# Patient Record
Sex: Male | Born: 1975 | Race: Black or African American | Hispanic: No | Marital: Single | Smoking: Current every day smoker
Health system: Southern US, Community
[De-identification: ages and names within clinical notes are randomized; demographics above are authoritative.]

## PROBLEM LIST (undated history)

## (undated) DIAGNOSIS — F209 Schizophrenia, unspecified: Secondary | ICD-10-CM

## (undated) DIAGNOSIS — K37 Unspecified appendicitis: Secondary | ICD-10-CM

## (undated) DIAGNOSIS — S82899A Other fracture of unspecified lower leg, initial encounter for closed fracture: Secondary | ICD-10-CM

## (undated) DIAGNOSIS — B1920 Unspecified viral hepatitis C without hepatic coma: Secondary | ICD-10-CM

## (undated) DIAGNOSIS — S42009A Fracture of unspecified part of unspecified clavicle, initial encounter for closed fracture: Secondary | ICD-10-CM

## (undated) DIAGNOSIS — F319 Bipolar disorder, unspecified: Secondary | ICD-10-CM

## (undated) HISTORY — PX: APPENDECTOMY: SHX54

## (undated) HISTORY — PX: ANKLE FRACTURE SURGERY: SHX122

---

## 2006-01-18 ENCOUNTER — Emergency Department (HOSPITAL_COMMUNITY): Admission: EM | Admit: 2006-01-18 | Discharge: 2006-01-18 | Payer: Self-pay | Admitting: Emergency Medicine

## 2006-03-09 ENCOUNTER — Emergency Department (HOSPITAL_COMMUNITY): Admission: EM | Admit: 2006-03-09 | Discharge: 2006-03-09 | Payer: Self-pay | Admitting: Family Medicine

## 2011-08-13 ENCOUNTER — Encounter: Payer: Self-pay | Admitting: Emergency Medicine

## 2011-08-13 ENCOUNTER — Emergency Department (HOSPITAL_COMMUNITY)
Admission: EM | Admit: 2011-08-13 | Discharge: 2011-08-13 | Disposition: A | Discharge status: Home / Self Care | Payer: Self-pay | Attending: Emergency Medicine | Admitting: Emergency Medicine

## 2011-08-13 DIAGNOSIS — R21 Rash and other nonspecific skin eruption: Secondary | ICD-10-CM | POA: Insufficient documentation

## 2011-08-13 DIAGNOSIS — L299 Pruritus, unspecified: Secondary | ICD-10-CM | POA: Insufficient documentation

## 2011-08-13 DIAGNOSIS — Z2089 Contact with and (suspected) exposure to other communicable diseases: Secondary | ICD-10-CM | POA: Insufficient documentation

## 2011-08-13 MED ORDER — CETIRIZINE HCL 10 MG PO CAPS: 10.0000 mg | ORAL_CAPSULE | Freq: Every day | ORAL | Status: DC

## 2011-08-13 MED ORDER — PERMETHRIN 5 % EX CREA: TOPICAL_CREAM | CUTANEOUS | Status: AC

## 2011-08-13 NOTE — ED Notes (Signed)
Pt will use cream

## 2011-08-13 NOTE — ED Provider Notes (Signed)
Medical screening examination/treatment/procedure(s) were performed by non-physician practitioner and as supervising physician I was immediately available for consultation/collaboration.  Geoffery Lyons, MD 08/13/11 [phone]

## 2011-08-13 NOTE — ED Provider Notes (Signed)
History     CSN: [id number] Arrival date & time: 08/13/2011  9:04 AM   First MD Initiated Contact with Patient 08/13/11 [phone]      No chief complaint on file.   (Consider location/radiation/quality/duration/timing/severity/associated sxs/prior treatment) HPI Comments: Patient with diffuse areas of itchiness over his body since yesterday. He states he was recently exposed to someone with scabies and thinks he may have the same. Patient has been using calamine lotion on his upper extremities with little relief. Patient denies fever or any other systemic symptoms.  Patient is a 35 y.o. male presenting with rash. The history is provided by the patient.  Rash  This is a new problem. The current episode started yesterday. The problem has not changed since onset.The problem is associated with nothing. There has been no fever. The rash is present on the torso, back, left arm and right arm. Associated symptoms include blisters and itching. Pertinent negatives include no pain and no weeping. He has tried anti-itch cream for the symptoms. The treatment provided mild relief.    No past medical history on file.  No past surgical history on file.  No family history on file.  History  Substance Use Topics  . Smoking status: Not on file  . Smokeless tobacco: Not on file  . Alcohol Use: Not on file      Review of Systems  Constitutional: Negative for fever.  HENT: Negative for sore throat and rhinorrhea.   Eyes: Negative for redness.  Respiratory: Negative for shortness of breath.   Cardiovascular: Negative for chest pain.  Gastrointestinal: Negative for abdominal pain.  Genitourinary: Negative for dysuria.  Musculoskeletal: Negative for myalgias.  Skin: Positive for itching and rash.  Neurological: Negative for headaches.    Allergies  Review of patient's allergies indicates no known allergies.  Home Medications   Current Outpatient Rx  Name Route Sig Dispense Refill  . IBUPROFEN  200 MG PO TABS Oral Take 200 mg by mouth every 6 (six) hours as needed. pain       BP 125/73  Pulse 75  Temp 98.6 F (37 C)  SpO2 100%  Physical Exam  Nursing note and vitals reviewed. Constitutional: He is oriented to person, place, and time. He appears well-developed and well-nourished.  HENT:  Head: Normocephalic and atraumatic.  Eyes: Conjunctivae are normal. Right eye exhibits no discharge. Left eye exhibits no discharge.  Neck: Neck supple.  Neurological: He is alert and oriented to person, place, and time.  Skin: Skin is warm and dry. No rash noted. There is erythema.       Diffusely scattered small wheals consistent with bite. Possibly scabies.  Psychiatric: He has a normal mood and affect.    ED Course  Procedures (including critical care time)  Labs Reviewed - No data to display No results found.   1. Scabies exposure    patient seen and examined. Given treatment for scabies. Counseled on techniques for removing infestation.    MDM  Patient with itchy areas consistent with bite. Will treat for scabies given recent exposure.       Eustace Moore [place], [place] 08/13/11 [phone]

## 2013-06-14 ENCOUNTER — Emergency Department (HOSPITAL_COMMUNITY)
Admission: EM | Admit: 2013-06-14 | Discharge: 2013-06-15 | Disposition: A | Discharge status: Home / Self Care | Payer: Self-pay | Attending: Emergency Medicine | Admitting: Emergency Medicine

## 2013-06-14 ENCOUNTER — Encounter (HOSPITAL_COMMUNITY): Payer: Self-pay | Admitting: Emergency Medicine

## 2013-06-14 DIAGNOSIS — Z5900 Homelessness unspecified: Secondary | ICD-10-CM

## 2013-06-14 DIAGNOSIS — R45851 Suicidal ideations: Secondary | ICD-10-CM | POA: Insufficient documentation

## 2013-06-14 DIAGNOSIS — F3289 Other specified depressive episodes: Secondary | ICD-10-CM | POA: Insufficient documentation

## 2013-06-14 DIAGNOSIS — F329 Major depressive disorder, single episode, unspecified: Secondary | ICD-10-CM

## 2013-06-14 DIAGNOSIS — F17200 Nicotine dependence, unspecified, uncomplicated: Secondary | ICD-10-CM | POA: Insufficient documentation

## 2013-06-14 DIAGNOSIS — F1020 Alcohol dependence, uncomplicated: Secondary | ICD-10-CM | POA: Diagnosis present

## 2013-06-14 DIAGNOSIS — F1010 Alcohol abuse, uncomplicated: Secondary | ICD-10-CM | POA: Insufficient documentation

## 2013-06-14 DIAGNOSIS — R12 Heartburn: Secondary | ICD-10-CM | POA: Insufficient documentation

## 2013-06-14 DIAGNOSIS — Z8719 Personal history of other diseases of the digestive system: Secondary | ICD-10-CM | POA: Insufficient documentation

## 2013-06-14 DIAGNOSIS — F411 Generalized anxiety disorder: Secondary | ICD-10-CM | POA: Insufficient documentation

## 2013-06-14 DIAGNOSIS — Z8781 Personal history of (healed) traumatic fracture: Secondary | ICD-10-CM | POA: Insufficient documentation

## 2013-06-14 DIAGNOSIS — F1420 Cocaine dependence, uncomplicated: Secondary | ICD-10-CM | POA: Diagnosis present

## 2013-06-14 DIAGNOSIS — Z8619 Personal history of other infectious and parasitic diseases: Secondary | ICD-10-CM | POA: Insufficient documentation

## 2013-06-14 HISTORY — DX: Unspecified appendicitis: K37

## 2013-06-14 HISTORY — DX: Fracture of unspecified part of unspecified clavicle, initial encounter for closed fracture: S42.009A

## 2013-06-14 HISTORY — DX: Other fracture of unspecified lower leg, initial encounter for closed fracture: S82.899A

## 2013-06-14 HISTORY — DX: Unspecified viral hepatitis C without hepatic coma: B19.20

## 2013-06-14 LAB — CBC
HCT: 47.3 % (ref 39.0–52.0)
Platelets: 242 10*3/uL (ref 150–400)
RDW: 12.5 % (ref 11.5–15.5)

## 2013-06-14 LAB — RAPID URINE DRUG SCREEN, HOSP PERFORMED
Amphetamines: NOT DETECTED
Barbiturates: NOT DETECTED
Benzodiazepines: NOT DETECTED
Opiates: NOT DETECTED
Tetrahydrocannabinol: NOT DETECTED

## 2013-06-14 LAB — COMPREHENSIVE METABOLIC PANEL
BUN: 13 mg/dL (ref 6–23)
CO2: 22 mEq/L (ref 19–32)
Calcium: 9.8 mg/dL (ref 8.4–10.5)
Creatinine, Ser: 1.02 mg/dL (ref 0.50–1.35)
GFR calc Af Amer: >90 mL/min (ref >90)
GFR calc non Af Amer: >90 mL/min (ref >90)
Potassium: 3.5 mEq/L (ref 3.5–5.1)
Total Bilirubin: 0.2 mg/dL — ABNORMAL LOW (ref 0.3–1.2)
Total Protein: 8.4 g/dL — ABNORMAL HIGH (ref 6.0–8.3)

## 2013-06-14 LAB — ACETAMINOPHEN LEVEL: Acetaminophen (Tylenol), Serum: <15 ug/mL (ref 10–30)

## 2013-06-14 MED ORDER — LORAZEPAM 1 MG PO TABS: 0.0000 mg | ORAL_TABLET | Freq: Two times a day (BID) | ORAL | Status: DC

## 2013-06-14 MED ORDER — IBUPROFEN 200 MG PO TABS: 600.0000 mg | ORAL_TABLET | Freq: Three times a day (TID) | ORAL | Status: DC | PRN

## 2013-06-14 MED ORDER — ALUM & MAG HYDROXIDE-SIMETH 200-200-20 MG/5ML PO SUSP: 30.0000 mL | ORAL | Status: DC | PRN

## 2013-06-14 MED ORDER — LORAZEPAM 1 MG PO TABS: 0.0000 mg | ORAL_TABLET | Freq: Four times a day (QID) | ORAL | Status: DC

## 2013-06-14 MED ORDER — ACETAMINOPHEN 325 MG PO TABS: 650.0000 mg | ORAL_TABLET | ORAL | Status: DC | PRN

## 2013-06-14 MED ORDER — NICOTINE 21 MG/24HR TD PT24: 21.0000 mg | MEDICATED_PATCH | Freq: Every day | TRANSDERMAL | Status: DC

## 2013-06-14 MED ORDER — LORAZEPAM 1 MG PO TABS: 1.0000 mg | ORAL_TABLET | Freq: Three times a day (TID) | ORAL | Status: DC | PRN

## 2013-06-14 MED ORDER — ONDANSETRON HCL 4 MG PO TABS: 4.0000 mg | ORAL_TABLET | Freq: Three times a day (TID) | ORAL | Status: DC | PRN

## 2013-06-14 NOTE — ED Notes (Signed)
Pt states that he quit taking his meds a few months ago bc he thought he was better. Pt states that he was drinking today and became overwhelmed with suicidal feelings. Pt denies any plan.

## 2013-06-14 NOTE — ED Provider Notes (Signed)
CSN: [id number]     Arrival date & time 06/14/13  2145 History   First MD Initiated Contact with Patient 06/14/13 2204     Chief Complaint  Patient presents with  . Suicidal  . Medical Clearance   (Consider location/radiation/quality/duration/timing/severity/associated sxs/prior Treatment) HPI Comments: Patient with history of anxiety and depression presents with complaint of worsening depression and suicidal ideation. Patient states that he has had worsening depression for some time but was overwhelmed with suicidal thoughts today. He spoke with a family member who urged him to come to the emergency department. Patient admits to being an alcoholic and continues to drink heavy amounts of alcohol. He admits to taking cocaine last night. He denies other drugs. He complains of heartburn but denies other medical complaints at the current time. Onset of symptoms insidious. Course is constant. Nothing makes symptoms better or worse.  The history is provided by the patient.    Past Medical History  Diagnosis Date  . Fx ankle   . Collar bone fracture   . Appendicitis   . Hepatitis C    Past Surgical History  Procedure Laterality Date  . Appendectomy     No family history on file. History  Substance Use Topics  . Smoking status: Current Every Day Smoker  . Smokeless tobacco: Never Used  . Alcohol Use: Yes    Review of Systems  Constitutional: Negative for fever.  HENT: Negative for rhinorrhea and sore throat.   Eyes: Negative for redness.  Respiratory: Negative for cough.   Cardiovascular: Negative for chest pain.  Gastrointestinal: Negative for nausea, vomiting, abdominal pain and diarrhea.  Genitourinary: Negative for dysuria.  Musculoskeletal: Negative for myalgias.  Skin: Negative for rash.  Neurological: Negative for headaches.  Psychiatric/Behavioral: Positive for suicidal ideas. Negative for sleep disturbance and self-injury. The patient is nervous/anxious.     Allergies   Review of patient's allergies indicates no known allergies.  Home Medications   Current Outpatient Rx  Name  Route  Sig  Dispense  Refill  . oxyCODONE-acetaminophen (PERCOCET/ROXICET) 5-325 MG per tablet   Oral   Take 1 tablet by mouth every 4 (four) hours as needed for pain.         Marland Kitchen PRESCRIPTION MEDICATION                BP 121/56  Pulse 92  Temp(Src) 98.5 F (36.9 C) (Oral)  Resp 20  Ht 5\' 7"  (1.702 m)  Wt 185 lb (83.915 kg)  BMI 28.97 kg/m2  SpO2 97% Physical Exam  Nursing note and vitals reviewed. Constitutional: He appears well-developed and well-nourished.  HENT:  Head: Normocephalic and atraumatic.  Eyes: Conjunctivae are normal. Right eye exhibits no discharge. Left eye exhibits no discharge.  Neck: Normal range of motion. Neck supple.  Cardiovascular: Normal rate, regular rhythm and normal heart sounds.   Pulmonary/Chest: Effort normal and breath sounds normal.  Abdominal: Soft. There is no tenderness.  Neurological: He is alert.  Skin: Skin is warm and dry.  Psychiatric: His mood appears anxious. He expresses suicidal ideation. He expresses no suicidal plans.  Patient is tearful    ED Course  Procedures (including critical care time) Labs Review Labs Reviewed  CBC - Abnormal; Notable for the following:    WBC 12.7 (*)    Hemoglobin 17.3 (*)    MCHC 36.6 (*)    All other components within normal limits  COMPREHENSIVE METABOLIC PANEL - Abnormal; Notable for the following:    Glucose, Bld 60 (*)  Total Protein 8.4 (*)    Total Bilirubin 0.2 (*)    All other components within normal limits  ETHANOL - Abnormal; Notable for the following:    Alcohol, Ethyl (B) 77 (*)    All other components within normal limits  SALICYLATE LEVEL - Abnormal; Notable for the following:    Salicylate Lvl <2.0 (*)    All other components within normal limits  URINE RAPID DRUG SCREEN (HOSP PERFORMED) - Abnormal; Notable for the following:    Cocaine POSITIVE (*)     All other components within normal limits  ACETAMINOPHEN LEVEL   Imaging Review No results found.  EKG Interpretation   None      10:19 PM Patient seen and examined. Work-up initiated. Holding orders completed.    Vital signs reviewed and are as follows: Filed Vitals:   06/14/13 2148  BP: 121/56  Pulse: 92  Temp: 98.5 F (36.9 C)  Resp: 20   11:26 PM Pt medically cleared. Accepted to [facility] by Luna Fuse to Dr. Shirlean Mylar service.    MDM   1. Depression   2. Alcohol abuse   3. Suicidal ideation    To [facility].     Renne Crigler, PA-C 06/14/13 2326

## 2013-06-14 NOTE — ED Notes (Signed)
Pt has been changed into blue scrubs.  Pt has coat, shoes, shirt, pants, underwear and shorts, wallet 2 debit cards and Identification card.

## 2013-06-14 NOTE — ED Notes (Signed)
Pt's belongings (2 bags) placed in TCU locker #26.

## 2013-06-15 ENCOUNTER — Inpatient Hospital Stay (HOSPITAL_COMMUNITY)
Admission: AD | Admit: 2013-06-15 | Discharge: 2013-06-19 | DRG: 897 | Disposition: A | Discharge status: Home / Self Care | Payer: No Typology Code available for payment source | Source: Intra-hospital | Attending: Psychiatry | Admitting: Psychiatry

## 2013-06-15 ENCOUNTER — Encounter (HOSPITAL_COMMUNITY): Payer: Self-pay

## 2013-06-15 DIAGNOSIS — Z5900 Homelessness unspecified: Secondary | ICD-10-CM

## 2013-06-15 DIAGNOSIS — R45851 Suicidal ideations: Secondary | ICD-10-CM

## 2013-06-15 DIAGNOSIS — F1420 Cocaine dependence, uncomplicated: Secondary | ICD-10-CM | POA: Diagnosis present

## 2013-06-15 DIAGNOSIS — F1010 Alcohol abuse, uncomplicated: Secondary | ICD-10-CM

## 2013-06-15 DIAGNOSIS — F1994 Other psychoactive substance use, unspecified with psychoactive substance-induced mood disorder: Secondary | ICD-10-CM | POA: Diagnosis present

## 2013-06-15 DIAGNOSIS — F1020 Alcohol dependence, uncomplicated: Principal | ICD-10-CM | POA: Diagnosis present

## 2013-06-15 DIAGNOSIS — Z79899 Other long term (current) drug therapy: Secondary | ICD-10-CM

## 2013-06-15 MED ORDER — ONDANSETRON HCL 4 MG PO TABS: 4.0000 mg | ORAL_TABLET | Freq: Three times a day (TID) | ORAL | Status: DC | PRN

## 2013-06-15 MED ORDER — IBUPROFEN 600 MG PO TABS
600.0000 mg | ORAL_TABLET | Freq: Three times a day (TID) | ORAL | Status: DC | PRN
  Administered 2013-06-16 – 2013-06-18 (×3): 600 mg via ORAL
  Filled 2013-06-15 (×3): qty 1

## 2013-06-15 MED ORDER — NICOTINE 21 MG/24HR TD PT24
21.0000 mg | MEDICATED_PATCH | Freq: Every day | TRANSDERMAL | Status: DC
  Administered 2013-06-15: 21 mg via TRANSDERMAL
  Administered 2013-06-15: 13:00:00 via TRANSDERMAL
  Administered 2013-06-17 – 2013-06-19 (×3): 21 mg via TRANSDERMAL
  Filled 2013-06-15 (×8): qty 1

## 2013-06-15 MED ORDER — ADULT MULTIVITAMIN W/MINERALS CH
1.0000 | ORAL_TABLET | Freq: Every day | ORAL | Status: DC
  Administered 2013-06-15 – 2013-06-19 (×5): 1 via ORAL
  Filled 2013-06-15 (×7): qty 1

## 2013-06-15 MED ORDER — HYDROXYZINE HCL 25 MG PO TABS
25.0000 mg | ORAL_TABLET | Freq: Four times a day (QID) | ORAL | Status: DC | PRN
  Administered 2013-06-16: 25 mg via ORAL
  Filled 2013-06-15: qty 1

## 2013-06-15 MED ORDER — LOPERAMIDE HCL 2 MG PO CAPS: 2.0000 mg | ORAL_CAPSULE | ORAL | Status: AC | PRN

## 2013-06-15 MED ORDER — CHLORDIAZEPOXIDE HCL 25 MG PO CAPS
25.0000 mg | ORAL_CAPSULE | ORAL | Status: AC
  Administered 2013-06-17 – 2013-06-18 (×2): 25 mg via ORAL
  Filled 2013-06-15 (×2): qty 1

## 2013-06-15 MED ORDER — TRAZODONE HCL 50 MG PO TABS
50.0000 mg | ORAL_TABLET | Freq: Every evening | ORAL | Status: DC | PRN
  Administered 2013-06-15 – 2013-06-18 (×4): 50 mg via ORAL
  Filled 2013-06-15 (×4): qty 1

## 2013-06-15 MED ORDER — ACETAMINOPHEN 325 MG PO TABS: 650.0000 mg | ORAL_TABLET | ORAL | Status: DC | PRN

## 2013-06-15 MED ORDER — THIAMINE HCL 100 MG/ML IJ SOLN: 100.0000 mg | Freq: Once | INTRAMUSCULAR | Status: DC

## 2013-06-15 MED ORDER — CHLORDIAZEPOXIDE HCL 25 MG PO CAPS
25.0000 mg | ORAL_CAPSULE | Freq: Three times a day (TID) | ORAL | Status: AC
  Administered 2013-06-16 – 2013-06-17 (×3): 25 mg via ORAL
  Filled 2013-06-15 (×3): qty 1

## 2013-06-15 MED ORDER — ONDANSETRON 4 MG PO TBDP: 4.0000 mg | ORAL_TABLET | Freq: Four times a day (QID) | ORAL | Status: AC | PRN

## 2013-06-15 MED ORDER — CHLORDIAZEPOXIDE HCL 25 MG PO CAPS
25.0000 mg | ORAL_CAPSULE | Freq: Every day | ORAL | Status: AC
  Administered 2013-06-19: 25 mg via ORAL
  Filled 2013-06-15: qty 1

## 2013-06-15 MED ORDER — MAGNESIUM HYDROXIDE 400 MG/5ML PO SUSP: 30.0000 mL | Freq: Every day | ORAL | Status: DC | PRN

## 2013-06-15 MED ORDER — CHLORDIAZEPOXIDE HCL 25 MG PO CAPS
25.0000 mg | ORAL_CAPSULE | Freq: Four times a day (QID) | ORAL | Status: AC | PRN
  Administered 2013-06-16: 25 mg via ORAL
  Filled 2013-06-15 (×2): qty 1

## 2013-06-15 MED ORDER — NICOTINE 21 MG/24HR TD PT24
MEDICATED_PATCH | TRANSDERMAL | Status: AC
  Filled 2013-06-15: qty 1

## 2013-06-15 MED ORDER — VITAMIN B-1 100 MG PO TABS
100.0000 mg | ORAL_TABLET | Freq: Every day | ORAL | Status: DC
  Administered 2013-06-16 – 2013-06-19 (×4): 100 mg via ORAL
  Filled 2013-06-15 (×6): qty 1

## 2013-06-15 MED ORDER — CHLORDIAZEPOXIDE HCL 25 MG PO CAPS
25.0000 mg | ORAL_CAPSULE | Freq: Four times a day (QID) | ORAL | Status: AC
  Administered 2013-06-15 – 2013-06-16 (×6): 25 mg via ORAL
  Filled 2013-06-15 (×6): qty 1

## 2013-06-15 MED ORDER — CHLORDIAZEPOXIDE HCL 25 MG PO CAPS
50.0000 mg | ORAL_CAPSULE | Freq: Once | ORAL | Status: AC
  Administered 2013-06-15: 50 mg via ORAL
  Filled 2013-06-15: qty 2
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
## 2013-06-15 NOTE — Progress Notes (Signed)
Patient admitted voluntarily requesting alcohol detox and medication to help his depression. Patient reports that he stopped taking his medication (Celexa) because it made him feel worse. Patient drinks (4) 40 oz beers daily and occasional THC use in order to make him feel better. Patient reports last using cocaine 10/9. Patient reports he is currently homeless. Medical hx include fx (left) ankle, collar bone fx, appendicitis and hepatitis C. Patient oriented to unit, snacks provided, safety maintained with 15 min checks. Patient denies si/hi/a/v hallucinations.

## 2013-06-15 NOTE — ED Notes (Signed)
RN Topher called to give report on pt while writer was in the middle of getting report/starting shift. One of writer's pt's is here for detox from ETOH, hx of seizures and had not been medicated all day. Thus Clinical research associate attended to this pt before calling to get report on this pt whom is now up for d/c to go to [facility]. Just spoke to RN Topher and explained delay in this writer calling him back.
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
## 2013-06-15 NOTE — BHH Group Notes (Signed)
BHH Group Notes:  (Clinical Social Work)  06/15/2013   3:00-4:00PM  Summary of Progress/Problems:   The main focus of today's process group was for the patient to identify ways in which they have sabotaged their own mental health wellness/recovery.  Motivational interviewing was used to explore the reasons they engage in this behavior, and reasons they may have for wanting to change.  The Stages of Change were explained to the group using a handout, and patients identified where they are with regard to changing self-defeating behaviors.  The patient expressed that when he gets upset and cries, he "MAKES" himself stop.  He was encouraged by other group members to let his pain come out naturally with the tears.  He stated he is in Contemplation stage of change, is not sure if he is willing to take medications.  Type of Therapy:  Process Group  Participation Level:  Active  Participation Quality:  Attentive and Sharing  Affect:  Blunted and Depressed  Cognitive:  Appropriate  Insight:  Improving  Engagement in Therapy:  Engaged  Modes of Intervention:  Education, Motivational Interviewing   Ambrose Mantle, LCSW 06/15/2013, 4:34 PM

## 2013-06-15 NOTE — ED Notes (Signed)
Patient is alert and oriented x3.  He was given DC instructions and follow up visit instructions.  Patient gave verbal understanding.  He was DC ambulatory under his own power to home.  V/S stable.  He was not showing any signs of distress on DC 
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
## 2013-06-15 NOTE — Progress Notes (Signed)
[facility] Group Notes:  (Nursing/MHT/Case Management/Adjunct)  Date:  06/15/2013  Time:  2000  Type of Therapy:  Psychoeducational Skills  Participation Level:  Active  Participation Quality:  Appropriate  Affect:  Appropriate  Cognitive:  Appropriate  Insight:  Improving  Engagement in Group:  Engaged  Modes of Intervention:  Education  Summary of Progress/Problems: The patient described his day as having been "weird". He states that he felt less depressed today. In addition, he stated that he was able to exercise outdoors. As a coping skill, he intends to work on not drinking. His goal for tomorrow is to find an alternative to not drinking.   Hazle Coca S 06/15/2013, 11:23 PM
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
## 2013-06-15 NOTE — H&P (Signed)
  Pt was seen by me today. Will continue current meds.  The detailed H&P note will be done by mid level (NP/PA).  

## 2013-06-16 MED ORDER — RISPERIDONE 0.5 MG PO TABS
0.5000 mg | ORAL_TABLET | Freq: Every day | ORAL | Status: DC
  Filled 2013-06-16 (×2): qty 1

## 2013-06-16 MED ORDER — FLUOXETINE HCL 10 MG PO CAPS
10.0000 mg | ORAL_CAPSULE | Freq: Every day | ORAL | Status: DC
  Filled 2013-06-16 (×3): qty 1

## 2013-06-16 NOTE — Progress Notes (Signed)
Patient ID: Fernando [name], male   DOB: [birth year], 37 y.o.   MRN: [id number] D)   Has been participating in the milieu this evening,  Has needed some redirection, was loud and intrusive at times, but stated he hadn't realized he was so loud, bent his ears forward to show scars from surgery and stated he has permanent tubes in his ears.  Refused hs risperdal, stated he had taken it before and it made him too tired to function.  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  Encouraged him to discuss it with his MD in am.   A)  Will continue to monitor for safety, continue POC R)  Safety maintained.

## 2013-06-16 NOTE — [facility] Group Notes (Signed)
[facility] Group Notes:  (Clinical Social Work)  06/16/2013   3:00-4:00PM  Summary of Progress/Problems:   The main focus of today's process group was to   identify the patient's current support system and decide on other supports that can be put in place.  The picture on workbook was used to discuss why additional supports are needed, and a hand-out was distributed with four definitions/levels of support, then used to talk about how patients have given and received all different kinds of support.  An emphasis was placed on using counselor, doctor, therapy groups, 12-step groups, and problem-specific support groups to expand supports.  The patient expressed full comprehension of the concepts presented, and agreed that there is a need to add more supports.  However, he remained angry throughout group at the doctor he has had this weekend, and made statements such as "I just have to wait to get out of here to do what I have to do."  He brought this up continually and had to be redirected multiple times.  Type of Therapy:  Process Group  Participation Level:  Active  Participation Quality:  Attentive, Intrusive and Redirectable  Affect:  Angry, Blunted and Depressed  Cognitive:  Alert  Insight:  Limited  Engagement in Therapy:  Engaged  Modes of Intervention:  Education,  Support and [employer], LCSW 06/16/2013, 4:23 PM

## 2013-06-16 NOTE — Progress Notes (Signed)
Date: 06/16/2013  Time: 1015  Group Topic/Focus:  Making Healthy Choices: The focus of this group is to help patients identify negative/unhealthy choices they were using prior to admission and identify positive/healthier coping strategies to replace them upon discharge.  Participation Level: Active  Participation Quality: Appropriate  Affect: Appropriate  Cognitive: Oriented  Insight: Improving  Engagement in Group: Improving  Additional Comments: was engaged in the discussion and partisipated  Kazuko Clemence A  06/16/2013  

## 2013-06-16 NOTE — Progress Notes (Signed)
Psychoeducational Group Note  Date: 06/16/2013 Time: 0930 Group Topic/Focus:  Gratefulness:  The focus of this group is to help patients identify what three things they are most grateful for in their lives. What helps ground them and to center them on their work to their recovery.  Participation Level:  Active  Participation Quality:  Appropriate  Affect:  Appropriate  Cognitive:  Alert  Insight:  Improving  Engagement in Group:  Engaged  Additional Comments:  Pt stated he was grateful for his God, his son and himself  Dione Housekeeper

## 2013-06-16 NOTE — Progress Notes (Addendum)
Patient ID: Fernando [name], male   DOB: [birth year], 37 y.o.   MRN: [id number]   S:   Very angry and threatning at times. Wants Xanax now and not willing to consider any other meds. Reports anxiety at times but not able to describe symptoms.  Thinks he may have Hep C [facility]. He is very demanding.   Psychiatric Specialty Exam:  Physical Exam   ROS none    General Appearance: Disheveled   Eye Contact:: None   Speech: Normal Rate   Volume: Normal   Mood: Irritable   Affect: Labile   Thought Process: Goal Directed   Orientation: Full (Time, Place, and Person)   Thought Content: none  Suicidal Thoughts: none   Homicidal Thoughts: denies   Memory: Immediate; Fair   Judgement: Impaired   Insight: Lacking   Psychomotor Activity: Decreased and Tremor   Concentration: Poor   Recall: Fair   Akathisia: No   Handed: Right   AIMS (if indicated):   Assets: Desire for Improvement           Psychological Evaluations:  Assessment:  DSM5:  Schizophrenia Disorders:  Obsessive-Compulsive Disorders:  Trauma-Stressor Disorders:  Substance/Addictive Disorders: Alcohol Related Disorder - Severe (303.90)  Depressive Disorders: Disruptive Mood Dysregulation Disorder (296.99)  AXIS I: Alcohol Abuse  AXIS II: Deferred  AXIS III:  Past Medical History   Diagnosis  Date   .  Fx ankle    .  Collar bone fracture    .  Appendicitis    .  Hepatitis C     AXIS IV: housing problems, occupational problems, problems related to social environment and problems with access to health care services  AXIS V: 35   Treatment Plan/Recommendations: 1. Admit for crisis management and stabilization.  2. Medication management to reduce current symptoms to base line and improve the patient's overall level of functioning.  3. Treat health problems as indicated.  4. Develop treatment plan to decrease risk of relapse upon discharge and to reduce the need for readmission.  5.  Psycho-social education regarding relapse prevention and self care.  6. Health care follow up as needed for medical problems.  7. Start prozac 10 mg qam for mood and anxiety  8. Start risperidone for mood  9. Hep B and Hep C screen is pending   Treatment Plan Summary:  Daily contact with patient to assess and evaluate symptoms and progress in treatment
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
## 2013-06-16 NOTE — Progress Notes (Signed)
[name] cont to have a difficult time, learning to adjust to the hospital regimen, take his medications and develop a therapeutic relationship with his physician ( he claims this did not happen).    A He attended his groups. HE got agitated and requested a prn medications and was given vistaril, and then, when he was awakened ( after falling asleep 30 min later) he became very agitated, arguing with this nurse about the MD's validity, why he placed the pt on prozac, the need to cont to force fluids and drink gatorade.... He began to cry slowly after being awakened, but this pt allowed this nurse to help him calm down and regulate his emotions.    R Safety is in place and poc moves forward.

## 2013-06-16 NOTE — ED Provider Notes (Signed)
Medical screening examination/treatment/procedure(s) were performed by non-physician practitioner and as supervising physician I was immediately available for consultation/collaboration.  Fredric Slabach R. Khyler Eschmann, MD 06/16/13 2335 

## 2013-06-16 NOTE — Progress Notes (Signed)
[facility] Group Notes:  (Nursing/MHT/Case Management/Adjunct)  Date:  06/16/2013  Time:  2000  Type of Therapy:  Psychoeducational Skills  Participation Level:  Active  Participation Quality:  Monopolizing  Affect:  Defensive  Cognitive:  Disorganized  Insight:  Lacking  Engagement in Group:  Resistant  Modes of Intervention:  Education  Summary of Progress/Problems: The patient expressed in group that he had a bad day since the staff didn't listen to him properly. He felt that the wrong medication was prescribed for him and that it makes him feel bad at times. His goal for tomorrow is to speak with the doctor about his medication. As a theme for the day, he stated that he has support but would not go into detail. He also stated at times that he was not going to take medication and that he wanted to be discharged or else.   Hazle Coca S 06/16/2013, 10:40 PM

## 2013-06-17 LAB — HEPATITIS C ANTIBODY: HCV Ab: REACTIVE — AB

## 2013-06-17 MED ORDER — HYDROXYZINE HCL 50 MG PO TABS: 50.0000 mg | ORAL_TABLET | Freq: Four times a day (QID) | ORAL | Status: AC | PRN

## 2013-06-17 MED ORDER — RISPERIDONE 1 MG PO TABS
1.0000 mg | ORAL_TABLET | Freq: Every day | ORAL | Status: DC
  Administered 2013-06-18: 1 mg via ORAL
  Filled 2013-06-17 (×4): qty 1

## 2013-06-17 NOTE — Progress Notes (Signed)
Adult Psychoeducational Group Note  Date:  06/17/2013 Time:  11:00am Group Topic/Focus:  Self Care:   The focus of this group is to help patients understand the importance of self-care in order to improve or restore emotional, physical, spiritual, interpersonal, and financial health.  Participation Level:  Active  Participation Quality:  Appropriate and Attentive  Affect:  Appropriate  Cognitive:  Alert and Appropriate  Insight: Appropriate  Engagement in Group:  Engaged  Modes of Intervention:  Discussion and Education  Additional Comments:   Pt attended and participated in group. Discussion today was on self care. When ask What am I like when I am feeling well and What do I need to do less often to keep my overall wellbeing? Pt stated when is feeling good he is perky, excited, laughing, and when ask what could he do to maintain his wellbeing pt stated stop being around negative people.  Shelly Bombard D 06/17/2013, 2:10 PM

## 2013-06-17 NOTE — Progress Notes (Signed)
Pt present at beginning of grief and loss group facilitated by chaplain.   Following description of group, pt left prior to group beginning.    Belva Crome MDiv

## 2013-06-17 NOTE — [facility] Group Notes (Signed)
[facility] LCSW Aftercare Discharge Planning Group Note   06/17/2013 8:45 AM  Participation Quality:  Alert and Appropriate   Mood/Affect:  Appropriate, Intrusive, Demanding  Depression Rating:  10  Anxiety Rating:  10  Thoughts of Suicide:  Pt denies SI, endorses HI  Will you contract for safety?   Yes  Current AVH:  Pt denies  Plan for Discharge/Comments:  Pt attended discharge planning group and actively participated in group.  CSW provided pt with today's workbook.  Pt reports having high anxiety today due to issues with his medication.  Pt states that he lives in [place].  Pt states that he wants to go to long term treatment.  CSW will assess for appropriate referrals.  No further needs voiced by pt at this time.     Transportation Means: Pt reports access to transportation  Supports: No supports mentioned at this time  Fernando [name], LCSWA 06/17/2013 9:47 AM

## 2013-06-17 NOTE — Tx Team (Signed)
Interdisciplinary Treatment Plan Update (Adult)  Date: 06/17/2013  Time Reviewed:  9:45 AM  Progress in Treatment: Attending groups: Yes Participating in groups:  Yes Taking medication as prescribed:  Yes Tolerating medication:  Yes Family/Significant othe contact made: CSW assessing  Patient understands diagnosis:  Yes Discussing patient identified problems/goals with staff:  Yes Medical problems stabilized or resolved:  Yes Denies suicidal/homicidal ideation: Yes Issues/concerns per patient self-inventory:  Yes Other:  New problem(s) identified: N/A  Discharge Plan or Barriers: CSW assessing for appropriate referrals.  Reason for Continuation of Hospitalization: Anxiety Depression Medication Stabilization  Comments: N/A  Estimated length of stay: 3-5 days  For review of initial/current patient goals, please see plan of care.  Attendees: Patient:     Family:     Physician:  Dr. Johnalagadda 06/17/2013 12:17 PM   Nursing:   Beverly Knight, RN 06/17/2013 12:17 PM   Clinical Social Worker:  London Tarnowski Horton, LCSWA 06/17/2013 12:17 PM   Other: Neil Mashburn, PA 06/17/2013 12:17 PM   Other:  Maseta Dorley, MA care coordination 06/17/2013 12:17 PM   Other:  Carol Davis, RN 06/17/2013 12:17 PM   Other:     Other:    Other:    Other:    Other:    Other:    Other:     Scribe for Treatment Team:   Horton, Adream Parzych Nicole, 06/17/2013 12:17 PM    

## 2013-06-17 NOTE — [facility] Group Notes (Signed)
[facility] LCSW Group Therapy  06/17/2013  1:15 PM   Type of Therapy:  Group Therapy  Participation Level:  Active  Participation Quality:  Appropriate and Attentive but Intrusive at times  Affect:  Appropriate  Cognitive:  Alert and Appropriate  Insight:  Developing/Improving and Engaged  Engagement in Therapy:  Developing/Improving and Engaged  Modes of Intervention:  Clarification, Confrontation, Discussion, Education, Exploration, Limit-setting, Orientation, Problem-solving, Rapport Building, Dance movement psychotherapist, Socialization and Support  Summary of Progress/Problems: Pt identified obstacles faced currently and processed barriers involved in overcoming these obstacles. Pt identified steps necessary for overcoming these obstacles and explored motivation (internal and external) for facing these difficulties head on. Pt further identified one area of concern in their lives and chose a goal to focus on for today. Pt shared that his biggest obstacle is missing work due to his medication making him sleepy.  Pt discussed issues with his medication and how it has impacted him before being redirected to stay on group topic.  Pt actively participated and was engaged in group discussion.    Reyes Ivan, LCSWA 06/17/2013 3:09 PM

## 2013-06-17 NOTE — [facility] Suicide Risk Assessment (Signed)
[facility] Adult Inpatient Family/Significant Other Suicide Prevention Education  Suicide Prevention Education:   Patient Refusal for Family/Significant Other Suicide Prevention Education: The patient has refused to provide written consent for family/significant other to be provided Family/Significant Other Suicide Prevention Education during admission and/or prior to discharge.  Physician notified.  CSW provided suicide prevention information with patient.    The suicide prevention education provided includes the following:  Suicide risk factors  Suicide prevention and interventions  National Suicide Hotline telephone number  [facility] assessment telephone number  [facility] Emergency Assistance 911  [facility] and/or Residential Mobile Crisis Unit telephone number   Reyes Ivan, [place] 06/17/2013 2:46 PM

## 2013-06-17 NOTE — Progress Notes (Signed)
Patient ID: Fernando [name], male   DOB: 09-Apr-1976, 37 y.o.   MRN: [id number] He has been up and to groups interacting with peers and staff. He has been labile today. Unset about not being able to get the anxiety medication he wants. Say,s that he needs the medication he had prior to admission. And that he drank because he gets anxious the he drinks.

## 2013-06-17 NOTE — Progress Notes (Signed)
Adult Psychoeducational Group Note  Date:  06/17/2013 Time:  10:09 PM  Group Topic/Focus:  Wrap-Up Group:   The focus of this group is to help patients review their daily goal of treatment and discuss progress on daily workbooks.  Participation Level:  Active  Participation Quality:  Appropriate  Affect:  Appropriate  Cognitive:  Appropriate  Insight: Appropriate  Engagement in Group:  Engaged  Modes of Intervention:  Support  Additional Comments:  Pt stated that one good thing that happened was that he learned that he doesn't have to react to negativity and to keep hisself relax and not get mad as well as not drinking will all keep him from getting depressed.   Earnstine Meinders 06/17/2013, 10:09 PM

## 2013-06-17 NOTE — Progress Notes (Signed)
[facility] MD Progress Note  06/17/2013 12:14 PM Fernando [name]  MRN:  [id number] Subjective:  Patient has been diagnosed with Alcohol and cocaine dependence and been homeless at this time. Patient states that he has depression and panic attacks and "XANAX IS THE ONLY MEDICATION WORKS FOR HIM". Patient stated that he was taking Xanax from a guy and seems like able to keep him calm and work in the past. Patient reported that called the physician's he spoke with concerned about his polysubstance abuse versus dependence and not offered Xanax. Patient states that antidepressant medications like Prozac, celexa and vistaril does not help him. He has been repeats himself the above. Reportedly he has daughter and needs to pay childsupport, other wise has to go to jail and was previously incarcerated.   Diagnosis:   DSM5: Schizophrenia Disorders:   Obsessive-Compulsive Disorders:   Trauma-Stressor Disorders:   Substance/Addictive Disorders:  Alcohol Withdrawal (291.81) Depressive Disorders:  Major Depressive Disorder - Unspecified (296.20)  Axis I: Substance Induced Mood Disorder and Alcohol dependence and cocaine dependence  ADL's:  Impaired  Sleep: Poor  Appetite:  Fair  Suicidal Ideation:  Patient endorses suicidal ideations but contracts for safety in the hospital Homicidal Ideation:  Denied AEB (as evidenced by):  Psychiatric Specialty Exam: ROS  Blood pressure 122/75, pulse 78, temperature 97 F (36.1 C), temperature source Oral, resp. rate 20, height 5\' 7"  (1.702 m), weight 86.637 kg (191 lb).Body mass index is 29.91 kg/(m^2).  General Appearance: Bizarre and Disheveled  Eye Contact::  Good  Speech:  Clear and Coherent and Pressured  Volume:  Increased  Mood:  Angry, Anxious and Irritable  Affect:  Non-Congruent and Labile  Thought Process:  Circumstantial, Irrelevant and Tangential  Orientation:  Full (Time, Place, and Person)  Thought Content:  Obsessions and Rumination  Suicidal  Thoughts:  Yes.  without intent/plan  Homicidal Thoughts:  No  Memory:  Immediate;   Fair  Judgement:  Impaired  Insight:  Lacking  Psychomotor Activity:  Psychomotor Retardation and Restlessness  Concentration:  Poor  Recall:  Fair  Akathisia:  NA  Handed:  Right  AIMS (if indicated):     Assets:  Communication Skills Desire for Improvement Physical Health  Sleep:  Number of Hours: 6.25   Current Medications: Current Facility-Administered Medications  Medication Dose Route Frequency Provider Last Rate Last Dose  . acetaminophen (TYLENOL) tablet 650 mg  650 mg Oral Q4H PRN Court Joy, PA-C      . chlordiazePOXIDE (LIBRIUM) capsule 25 mg  25 mg Oral Q6H PRN Court Joy, PA-C   25 mg at 06/16/13 2118  . chlordiazePOXIDE (LIBRIUM) capsule 25 mg  25 mg Oral TID Court Joy, PA-C   25 mg at 06/17/13 4098   Followed by  . chlordiazePOXIDE (LIBRIUM) capsule 25 mg  25 mg Oral BH-qamhs Court Joy, PA-C       Followed by  . [START ON 06/19/2013] chlordiazePOXIDE (LIBRIUM) capsule 25 mg  25 mg Oral Daily Court Joy, PA-C      . FLUoxetine (PROZAC) capsule 10 mg  10 mg Oral Daily Wonda Cerise, MD      . hydrOXYzine (ATARAX/VISTARIL) tablet 25 mg  25 mg Oral Q6H PRN Court Joy, PA-C   25 mg at 06/16/13 1016  . ibuprofen (ADVIL,MOTRIN) tablet 600 mg  600 mg Oral Q8H PRN Court Joy, PA-C   600 mg at 06/16/13 2118  . loperamide (IMODIUM) capsule 2-4 mg  2-4 mg Oral  PRN Court Joy, PA-C      . magnesium hydroxide (MILK OF MAGNESIA) suspension 30 mL  30 mL Oral Daily PRN Court Joy, PA-C      . multivitamin with minerals tablet 1 tablet  1 tablet Oral Daily Court Joy, PA-C   1 tablet at 06/17/13 0741  . nicotine (NICODERM CQ - dosed in mg/24 hours) patch 21 mg  21 mg Transdermal Daily Court Joy, PA-C   21 mg at 06/17/13 0741  . ondansetron (ZOFRAN) tablet 4 mg  4 mg Oral Q8H PRN Court Joy, PA-C      . ondansetron (ZOFRAN-ODT) disintegrating  tablet 4 mg  4 mg Oral Q6H PRN Court Joy, PA-C      . risperiDONE (RISPERDAL) tablet 0.5 mg  0.5 mg Oral QHS Wonda Cerise, MD      . thiamine (B-1) injection 100 mg  100 mg Intramuscular Once Court Joy, PA-C      . thiamine (VITAMIN B-1) tablet 100 mg  100 mg Oral Daily Court Joy, PA-C   100 mg at 06/17/13 0741  . traZODone (DESYREL) tablet 50 mg  50 mg Oral QHS PRN,MR X 1 Court Joy, PA-C   50 mg at 06/16/13 2118    Lab Results:  Results for orders placed during the hospital encounter of 06/15/13 (from the past 48 hour(s))  HEPATITIS C ANTIBODY     Status: Abnormal   Collection Time    06/16/13  7:13 PM      Result Value Range   HCV Ab Reactive (*) NEGATIVE   Comment: (NOTE)                                                                               This test is for screening purposes only.  Reactive results should be     confirmed by an alternative method.  Suggest HCV Qualitative, PCR,     test code 40981.  Specimens will be stable for reflex testing up to 3     days after collection.     Performed at [facility]  HEPATITIS B SURFACE ANTIGEN     Status: None   Collection Time    06/16/13  7:13 PM      Result Value Range   Hepatitis B Surface Ag NEGATIVE  NEGATIVE   Comment: Performed at [facility]    Physical Findings: AIMS: Facial and Oral Movements Muscles of Facial Expression: None, normal Lips and Perioral Area: None, normal Jaw: None, normal Tongue: None, normal,Extremity Movements Upper (arms, wrists, hands, fingers): None, normal Lower (legs, knees, ankles, toes): None, normal, Trunk Movements Neck, shoulders, hips: None, normal, Overall Severity Severity of abnormal movements (highest score from questions above): None, normal Incapacitation due to abnormal movements: None, normal Patient's awareness of abnormal movements (rate only patient's report): No Awareness, Dental Status Current problems with teeth and/or dentures?:  No Does patient usually wear dentures?: No  CIWA:  CIWA-Ar Total: 2 COWS:     Treatment Plan Summary: Daily contact with patient to assess and evaluate symptoms and progress in treatment Medication management  Plan: Continue Librium protocol for alcohol detox treatment  Change risperidone to 1 mg twice daily for agitation Discontinue Prozac Treatment Plan/Recommendations:  1. Admit for crisis management and stabilization. 2. Medication management to reduce current symptoms to base line and improve the patient's overall level of functioning. 3. Treat health problems as indicated. 4. Develop treatment plan to decrease risk of relapse upon discharge and to reduce the need for readmission. 5. Psycho-social education regarding relapse prevention and self care. 6. Health care follow up as needed for medical problems. 7. Disposition plans and progress and may discharge on Wednesday when he can contract for safety and completeness of detox protocol.    Medical Decision Making Problem Points:  Established problem, worsening (2), New problem, with no additional work-up planned (3) and Review of psycho-social stressors (1) Data Points:  Review or order clinical lab tests (1) Review or order medicine tests (1) Review of medication regiment & side effects (2) Review of new medications or change in dosage (2)  I certify that inpatient services furnished can reasonably be expected to improve the patient's condition.   Nehemiah Settle., M.D. 06/17/2013, 12:14 PM

## 2013-06-18 DIAGNOSIS — F1020 Alcohol dependence, uncomplicated: Principal | ICD-10-CM

## 2013-06-18 DIAGNOSIS — F1994 Other psychoactive substance use, unspecified with psychoactive substance-induced mood disorder: Secondary | ICD-10-CM

## 2013-06-18 DIAGNOSIS — F1420 Cocaine dependence, uncomplicated: Secondary | ICD-10-CM

## 2013-06-18 MED ORDER — BUSPIRONE HCL 15 MG PO TABS
7.5000 mg | ORAL_TABLET | Freq: Two times a day (BID) | ORAL | Status: DC
  Administered 2013-06-18 – 2013-06-19 (×2): 7.5 mg via ORAL
  Filled 2013-06-18 (×6): qty 1

## 2013-06-18 MED ORDER — CITALOPRAM HYDROBROMIDE 20 MG PO TABS
20.0000 mg | ORAL_TABLET | Freq: Every day | ORAL | Status: DC
  Administered 2013-06-19: 20 mg via ORAL
  Filled 2013-06-18 (×4): qty 1

## 2013-06-18 NOTE — [facility] Group Notes (Signed)
[facility] LCSW Group Therapy  06/18/2013  1:15 PM   Type of Therapy:  Group Therapy  Participation Level:  Active  Participation Quality:  Appropriate and Attentive  Affect:  Appropriate, Anxious  Cognitive:  Alert and Appropriate  Insight:  Developing/Improving and Engaged  Engagement in Therapy:  Developing/Improving and Engaged  Modes of Intervention:  Clarification, Confrontation, Discussion, Education, Exploration, Limit-setting, Orientation, Problem-solving, Rapport Building, Dance movement psychotherapist, Socialization and Support  Summary of Progress/Problems: The topic for group therapy was feelings about diagnosis.  Pt actively participated in group discussion on their past and current diagnosis and how they feel towards this.  Pt also identified how society and family members judge them, based on their diagnosis as well as stereotypes and stigmas.   Pt came into group late holding a sign up that said anxiety.  Pt shared that he was told he is being disrespectful to people on the unit and that he should just shut up and hold this sign up to explain what is wrong with him.  Pt asked why we aren't giving him a pill to help with his anxiety and CSW explained that a pill doesn't fix everything and that pt should work on his coping skills.  Pt was disruptive to group before being pulled out by the doctor for the remainder of group.    Reyes Ivan, LCSWA 06/18/2013 2:46 PM

## 2013-06-18 NOTE — Progress Notes (Signed)
Adult Psychoeducational Group Note  Date:  06/18/2013 Time:  11:00am Group Topic/Focus:  Recovery Goals:   The focus of this group is to identify appropriate goals for recovery and establish a plan to achieve them.  Participation Level:  Did Not Attend  Participation Quality:    Affect:    Cognitive:    Insight:   Engagement in Group:    Modes of Intervention:    Additional Comments:  Pt did not attend group.  Shelly Bombard D 06/18/2013, 1:15 PM

## 2013-06-18 NOTE — Progress Notes (Signed)
D: Patient denies SI/HI/AVH. Patient rates hopelessness as 1,  depression as 1, and anxiety as 10.  Patient affect is anxious. Mood is anxious and irritable.  Pt states, "I'm upset that I'm not getting Xanax.  That is that only things that works for me."  Patient did attend evening group. Patient visible on the milieu. No distress noted. A: Support and encouragement offered. Scheduled medications given to pt. Q 15 min checks continued for patient safety. R: Patient receptive. Patient remains safe on the unit.

## 2013-06-18 NOTE — Progress Notes (Signed)
Recreation Therapy Notes   Date: 10.14.2014 Time: 2:45pm Location: [address] Dayroom  Group Topic: Animal Assisted Activities (AAA)  Behavioral Response: Engaged, Attentive  Affect: Euthymic  Clinical Observations/Feedback: Dog Team: Raliegh & handler. Patient interacted appropriately with peer, dog team, LRT and MHT.   Lanard Arguijo L Keric Zehren, LRT/CTRS  Saranya Harlin L 06/18/2013 4:09 PM 

## 2013-06-18 NOTE — Progress Notes (Signed)
Patient ID: Fernando [name], male   DOB: 01-08-1976, 37 y.o.   MRN: [id number] [facility] MD Progress Note  06/18/2013 1:48 PM [name]  MRN:  [id number]  Subjective:  Patient has stated that he is having anxiety and panic attacks. Patient has been disrespectful to the peer group in glucose this morning patient stated that he was interrupted and not allowed to speak which made him angry and anxious. Patient stated that I am not asking Xanax for anxiety but consented for Celexa and BuSpar which he was taken when he was incarcerated in the past. Patient does not want to take medication like risperidone which make him sleepy.  Patient states that he has depression and panic attacks and "XANAX IS THE ONLY MEDICATION WORKS FOR HIM". Patient stated that he was taking Xanax from a guy and seems like able to keep him calm and work in the past. Patient states that antidepressant medications like Prozac, celexa and vistaril does not help him. Reportedly he has daughter and needs to pay childsupport, other wise has to go to jail and was previously incarcerated.   Diagnosis:   DSM5: Schizophrenia Disorders:   Obsessive-Compulsive Disorders:   Trauma-Stressor Disorders:   Substance/Addictive Disorders:  Alcohol Withdrawal (291.81) Depressive Disorders:  Major Depressive Disorder - Unspecified (296.20)  Axis I: Substance Induced Mood Disorder and Alcohol dependence and cocaine dependence  ADL's:  Impaired  Sleep: Poor  Appetite:  Fair  Suicidal Ideation:  Patient endorses suicidal ideations but contracts for safety in the hospital Homicidal Ideation:  Denied AEB (as evidenced by):  Psychiatric Specialty Exam: ROS  Blood pressure 126/83, pulse 78, temperature 96 F (35.6 C), temperature source Oral, resp. rate 16, height 5\' 7"  (1.702 m), weight 86.637 kg (191 lb).Body mass index is 29.91 kg/(m^2).  General Appearance: Bizarre and Disheveled  Eye Contact::  Good  Speech:  Clear and Coherent and  Pressured  Volume:  Increased  Mood:  Angry, Anxious and Irritable  Affect:  Non-Congruent and Labile  Thought Process:  Circumstantial, Irrelevant and Tangential  Orientation:  Full (Time, Place, and Person)  Thought Content:  Obsessions and Rumination  Suicidal Thoughts:  Yes.  without intent/plan  Homicidal Thoughts:  No  Memory:  Immediate;   Fair  Judgement:  Impaired  Insight:  Lacking  Psychomotor Activity:  Psychomotor Retardation and Restlessness  Concentration:  Poor  Recall:  Fair  Akathisia:  NA  Handed:  Right  AIMS (if indicated):     Assets:  Communication Skills Desire for Improvement Physical Health  Sleep:  Number of Hours: 6   Current Medications: Current Facility-Administered Medications  Medication Dose Route Frequency Provider Last Rate Last Dose  . acetaminophen (TYLENOL) tablet 650 mg  650 mg Oral Q4H PRN Court Joy, PA-C      . busPIRone (BUSPAR) tablet 7.5 mg  7.5 mg Oral BID Nehemiah Settle, MD      . Melene Muller ON 06/19/2013] chlordiazePOXIDE (LIBRIUM) capsule 25 mg  25 mg Oral Daily Court Joy, PA-C      . Melene Muller ON 06/19/2013] citalopram (CELEXA) tablet 20 mg  20 mg Oral Daily Nehemiah Settle, MD      . ibuprofen (ADVIL,MOTRIN) tablet 600 mg  600 mg Oral Q8H PRN Court Joy, PA-C   600 mg at 06/18/13 1152  . magnesium hydroxide (MILK OF MAGNESIA) suspension 30 mL  30 mL Oral Daily PRN Court Joy, PA-C      . multivitamin with minerals tablet 1  tablet  1 tablet Oral Daily Court Joy, PA-C   1 tablet at 06/18/13 4540  . nicotine (NICODERM CQ - dosed in mg/24 hours) patch 21 mg  21 mg Transdermal Daily Court Joy, PA-C   21 mg at 06/18/13 0805  . ondansetron (ZOFRAN) tablet 4 mg  4 mg Oral Q8H PRN Court Joy, PA-C      . risperiDONE (RISPERDAL) tablet 1 mg  1 mg Oral QHS Nehemiah Settle, MD      . thiamine (B-1) injection 100 mg  100 mg Intramuscular Once Court Joy, PA-C      . thiamine  (VITAMIN B-1) tablet 100 mg  100 mg Oral Daily Court Joy, PA-C   100 mg at 06/18/13 0802  . traZODone (DESYREL) tablet 50 mg  50 mg Oral QHS PRN,MR X 1 Court Joy, PA-C   50 mg at 06/17/13 2146    Lab Results:  Results for orders placed during the hospital encounter of 06/15/13 (from the past 48 hour(s))  HEPATITIS C ANTIBODY     Status: Abnormal   Collection Time    06/16/13  7:13 PM      Result Value Range   HCV Ab Reactive (*) NEGATIVE   Comment: (NOTE)                                                                               This test is for screening purposes only.  Reactive results should be     confirmed by an alternative method.  Suggest HCV Qualitative, PCR,     test code 98119.  Specimens will be stable for reflex testing up to 3     days after collection.     Performed at [facility]  HEPATITIS B SURFACE ANTIGEN     Status: None   Collection Time    06/16/13  7:13 PM      Result Value Range   Hepatitis B Surface Ag NEGATIVE  NEGATIVE   Comment: Performed at [facility]    Physical Findings: AIMS: Facial and Oral Movements Muscles of Facial Expression: None, normal Lips and Perioral Area: None, normal Jaw: None, normal Tongue: None, normal,Extremity Movements Upper (arms, wrists, hands, fingers): None, normal Lower (legs, knees, ankles, toes): None, normal, Trunk Movements Neck, shoulders, hips: None, normal, Overall Severity Severity of abnormal movements (highest score from questions above): None, normal Incapacitation due to abnormal movements: None, normal Patient's awareness of abnormal movements (rate only patient's report): No Awareness, Dental Status Current problems with teeth and/or dentures?: No Does patient usually wear dentures?: No  CIWA:  CIWA-Ar Total: 2 COWS:     Treatment Plan Summary: Daily contact with patient to assess and evaluate symptoms and progress in treatment Medication management  Plan: Continue  Librium protocol for alcohol detox treatment Continue risperidone to 1 mg Qhs for agitation and recommends compliance Start Celexa 20 mg QD and Buspar 7.5 mg PO BID Treatment Plan/Recommendations:  1. Admit for crisis management and stabilization. 2. Medication management to reduce current symptoms to base line and improve the patient's overall level of functioning. 3. Treat health problems as indicated. 4. Develop treatment plan to  decrease risk of relapse upon discharge and to reduce the need for readmission. 5. Psycho-social education regarding relapse prevention and self care. 6. Health care follow up as needed for medical problems. 7. Disposition plans and progress and may discharge on Wednesday when he can contract for safety and completeness of detox protocol.    Medical Decision Making Problem Points:  Established problem, worsening (2), New problem, with no additional work-up planned (3) and Review of psycho-social stressors (1) Data Points:  Review or order clinical lab tests (1) Review or order medicine tests (1) Review of medication regiment & side effects (2) Review of new medications or change in dosage (2)  I certify that inpatient services furnished can reasonably be expected to improve the patient's condition.   Nehemiah Settle., M.D. 06/18/2013, 1:48 PM

## 2013-06-18 NOTE — Progress Notes (Addendum)
[facility] Adult Case Management Discharge Plan :  Will you be returning to the same living situation after discharge: Yes,  pt was staying with friend prior to admission and verbalizes a plan to stay with friend until admission to [facility] on Thursday.   At discharge, do you have transportation home?:Yes,  access to transportation Do you have the ability to pay for your medications:Yes,  access to meds  Release of information consent forms completed and in the chart;  Patient's signature needed at discharge.  Patient to Follow up at: Follow-up Information   Follow up with [facility] On 06/20/2013. (Arrive at 8:00 am promptly for admission screening, possible admission on this date so bring supply of medication and your belongings. Date given by French Ana.)    Contact information:   [address] [place], [place] [address] Phone: [phone] Fax: [phone]      Follow up with [facility]. (Walk in for medication management and therapy.  Walk in clinic is Monday - Friday 8 am - 3 pm.  )    Contact information:   [address] [place], [place] [address] Phone: [phone] Fax: [phone]      Patient denies SI/HI:   Yes,  denies SI/HI    Safety Planning and Suicide Prevention discussed:  Yes,  discussed with pt.  Pt refused consent to contact family/friend.  See suicide prevention education note.  Carmina Miller 06/18/2013, 11:22 AM

## 2013-06-18 NOTE — Progress Notes (Signed)
D Pt.  Was angry when writer started the assessment, pt. Started talking about his medications and about his Hep-C. Results.  A Writer attempted to explain to the pt. That the was a positive reaction but not the final diagnosis.  Pt. Would need further test.   Writer offered support.  R Pt. Calmed down and was satisfied with the response.  Pt. Decided to take his risperdal after we discussed his medications.  No complaints of pain or discomfort noted.  Pt. Denies SI and HI, denies A and VH at this time.  `

## 2013-06-18 NOTE — Progress Notes (Signed)
Adult Psychoeducational Group Note  Date:  06/18/2013 Time:  9:13 PM  Group Topic/Focus:  Wrap-Up Group:   The focus of this group is to help patients review their daily goal of treatment and discuss progress on daily workbooks.  Participation Level:  Active  Participation Quality:  Appropriate  Affect:  Appropriate  Cognitive:  Alert and Oriented  Insight: Appropriate  Engagement in Group:  Developing/Improving  Modes of Intervention:  Clarification, Exploration and Support  Additional Comments:  Patient stated that one positive is that he was able to put his wall down and able to take his meds. Patient stated that his short term goal is to get out of here and attend a 28 day program. Patient reported that he had increased anxiety earlier before he got new medications.  Yareni Creps, Randal Buba 06/18/2013, 9:13 PM

## 2013-06-18 NOTE — Progress Notes (Signed)
D: Patient denies SI/HI and A/V hallucinations; patient reports sleep is well; reports appetite is good ; reports energy level is normal ; reports ability to pay attention is good; rates depression as 4/10; rates hopelessness 1/10; rates anxiety as 10/10; patient reports constant and anxiety and physician is aware and patient reports that he has no medications to take for it and states " I will go off if they dont listen to me; but I want really go off"; patient denies withdrawal symptoms other than anxiety  A: Monitored q 15 minutes; patient encouraged to attend groups; patient educated about medications; patient given medications per physician orders; patient encouraged to express feelings and/or concerns  R: Patient is loud, intrusive, and has to be redirected several times; patient has several complaints of anxiety but refuses to take certain medications and at this time does not have an as needed medication to address anxiety concerns; patient is talking with serveral staff members about his feelings and alternatives have been offered to the patient and he refuses to do anything else until he meets with the physician about his medications; patient's interaction with staff and peers is appropriate at most times but patient does have instances with loud outburst and has to be redirected; patient is attending all groups

## 2013-06-18 NOTE — Progress Notes (Signed)
The focus of this group is to educate the patient on the purpose and policies of crisis stabilization and provide a format to answer questions about their admission.  The group details unit policies and expectations of patients while admitted.  Patient attended 0900 nurse education orientation group this morning.  Patient listened attentively, appropriate affect, alert, appropriate insight and engagement.  Today will work on 3 goals for discharge.

## 2013-06-18 NOTE — Progress Notes (Signed)
Recreation Therapy Notes  Date: 10.13.2014 Time: 3:00pm Location: [address] Dayroom  Group Topic: Communication, Team Building, Problem Solving  Goal Area(s) Addresses:  Patient will effectively work with peer towards shared goal.  Patient will identify skill used to make activity successful.  Patient will identify how skills used during activity can be used to reach post d/c goals.   Behavioral Response: Engaged, Appropriate   Intervention: Problem Solving Activitiy  Activity: Life Boat. Patients were given a scenario about being on a sinking yacht. Patients were informed the yacht included 15 guest, 8 of which could be placed on the life boat, along with all group members. Individuals on guest list were of varying socioeconomic classes such as a Education officer, museum, Materials engineer, Midwife, Tree surgeon.   Education: Customer service manager, Discharge Planning   Education Outcome: Acknowledges understanding  Clinical Observations/Feedback: Patient actively engaged in activity, voicing his opinion and debating with group members appropriately. Patient identified communication as a skill needed to make activity successful, as well as compromise, individual ability to help group and logic as qualities that guided his decision making. Patient additionally identified importance of using communication to promote whole wellness.  Marykay Lex Lael Wetherbee, LRT/CTRS  Laverne Klugh L 06/18/2013 9:09 AM

## 2013-06-19 MED ORDER — CITALOPRAM HYDROBROMIDE 20 MG PO TABS: 20.0000 mg | ORAL_TABLET | Freq: Every day | ORAL | Status: DC

## 2013-06-19 MED ORDER — RISPERIDONE 1 MG PO TABS: 1.0000 mg | ORAL_TABLET | Freq: Every day | ORAL | Status: DC

## 2013-06-19 MED ORDER — BUSPIRONE HCL 7.5 MG PO TABS: 7.5000 mg | ORAL_TABLET | Freq: Two times a day (BID) | ORAL | Status: DC

## 2013-06-19 NOTE — Progress Notes (Signed)
Adult Psychoeducational Group Note  Date:  06/19/2013 Time:  11:00am  Group Topic/Focus:  Personal Choices and Values:   The focus of this group is to help patients assess and explore the importance of values in their lives, how their values affect their decisions, how they express their values and what opposes their expression.  Participation Level:  Active  Participation Quality:  Attentive and Intrusive  Affect:  Blunted  Cognitive:  Alert  Insight: Appropriate  Engagement in Group:  Distracting  Modes of Intervention:  Discussion and Education  Additional Comments:  Pt attended and participated in group. Discussion today was on personal development. The question was asked What is your current crisis, what were your triggers, and what could you have done to not make the situation worse? Pt stated his crisis was getting back to work so he can pay his child support and meds that make him sleepy. Pt stated what he could have done differently to not make things worse is drinking and listening more.   Shelly Bombard D 06/19/2013, 1:13 PM

## 2013-06-19 NOTE — Progress Notes (Signed)
Patient ID: Fernando [name], male   DOB: Aug 01, 1976, 37 y.o.   MRN: [id number] Patient discharged per physician order; patient denies SI/HI and A/V hallucinations; patient received samples, prescriptions, and copy of AVS after it was reviewed; patient had no other questions or concerns at this time; patient verbalized and signed that he received all belongings; patient left the unit ambulatory

## 2013-06-19 NOTE — [facility] Group Notes (Signed)
[facility] LCSW Group Therapy  06/19/2013  1:15 PM   Type of Therapy:  Group Therapy  Participation Level:  Active  Participation Quality:  Appropriate and Attentive  Affect:  Appropriate and Irritable  Cognitive:  Alert and Appropriate  Insight:  Developing/Improving and Engaged  Engagement in Therapy:  Developing/Improving and Engaged  Modes of Intervention:  Clarification, Confrontation, Discussion, Education, Exploration, Limit-setting, Orientation, Problem-solving, Rapport Building, Dance movement psychotherapist, Socialization and Support  Summary of Progress/Problems: The topic for group today was emotional regulation.  This group focused on both positive and negative emotion identification and allowed group members to process ways to identify feelings, regulate negative emotions, and find healthy ways to manage internal/external emotions. Group members were asked to reflect on a time when their reaction to an emotion led to a negative outcome and explored how alternative responses using emotion regulation would have benefited them. Group members were also asked to discuss a time when emotion regulation was utilized when a negative emotion was experienced. Pt was able to process, as a group, their anger and frustration in regards to this hospitalization, still being hospitalized and feeling like their needs are not being met while here.  Pt was insightful in sharing that his focus has been on drinking and not his family and has been using to cope with his negative emotions.  Pt states that he wants to get back to going to the gym.  Pt actively participated and was engaged in group discussion.    Fernando [name], LCSWA 06/19/2013 2:42 PM

## 2013-06-19 NOTE — Tx Team (Signed)
Interdisciplinary Treatment Plan Update (Adult)  Date: 06/19/2013  Time Reviewed:  9:45 AM  Progress in Treatment: Attending groups: Yes Participating in groups:  Yes Taking medication as prescribed:  Yes Tolerating medication:  Yes Family/Significant othe contact made: No, pt refused Patient understands diagnosis:  Yes Discussing patient identified problems/goals with staff:  Yes Medical problems stabilized or resolved:  Yes Denies suicidal/homicidal ideation: Yes Issues/concerns per patient self-inventory:  Yes Other:  New problem(s) identified: N/A  Discharge Plan or Barriers: Pt will follow up at [facility] for further inpatient treatment and [facility] for outpatient medication management and therapy.    Reason for Continuation of Hospitalization: Stable to d/c today  Comments: N/A  Estimated length of stay: D/C today  For review of initial/current patient goals, please see plan of care.  Attendees: Patient:  Fernando [name] 06/19/2013 10:19 AM   Family:     Physician:  Dr. Javier Glazier 06/19/2013 10:19 AM   Nursing:   Nestor Ramp, RN 06/19/2013 10:19 AM   Clinical Social Worker:  Reyes Ivan, LCSWA 06/19/2013 10:19 AM   Other: Verne Spurr, PA 06/19/2013 10:19 AM   Other:  Frankey Shown, MA care coordination 06/19/2013 10:19 AM   Other:  Juline Patch, LCSW 06/19/2013 10:19 AM   Other:  Burnetta Sabin, RN 06/19/2013 10:19 AM   Other: Tomasita Morrow, care coordination 06/19/2013 10:19 AM   Other:    Other:    Other:    Other:      Scribe for Treatment Team:   Carmina Miller, 06/19/2013 , 10:19 AM

## 2013-06-19 NOTE — Discharge Summary (Signed)
Physician Discharge Summary Note  Patient:  Fernando [name] is an 37 y.o., male MRN:  [id number] DOB:  09/13/1975 Patient phone:  [phone] (home)  Patient address:   [address] [place] [address],   Date of Admission:  06/15/2013 Date of Discharge: 06/19/2013  Reason for Admission:  Alcohol detox  Discharge Diagnoses: Principal Problem:   Psychoactive substance-induced organic mood disorder Active Problems:   Alcohol dependence syndrome   Cocaine dependence, abuse  ROS  DSM5: Level of Care:  OP  Hospital Course:        Fernando [name] is a 37 year old AAmale presented to [facility] requesting detox from alcohol. He states he has been drinking daily for 2 years approximately 10 (12oz) beers a day. He does report smoking a blunt a day since he was 37 years old. He is homeless, jobless, and origninally from [place] where he was admitted for detox in June of this year. He has no resources and states he is depressed for about 2-3 months now and has SI but no plan, nor previous attempts.       Fernando [name] was admitted for alcohol detox, homelessness and crisis management.  He was admitted to the adult unit, he was evaluated and his symptoms were identified. He was difficult to assess due to his irritability and general lack of cooperation.  Medication management was initiated and he was detoxed with the Librium protocol. .  Medical problems were identified and treated.  Home medications was restarted as appropriate.  Improvement was monitored by CIWA scores and patient's daily report of withdrawal symptom reduction.         Emotional and mental status was monitored by daily self inventory reports completed by the patient and clinical staff.  The patient was evaluated by the treatment team for stability and plans for continued recovery upon discharge. Fernando [name] was loud and intrusive during this admission and often had to be redirected. He was profane and inappropriate with other patients during  groups, flirtatious and invasive with other patients.  He demonstrated little tolerance for delayed gratification.          Fernando [name] was offered further treatment for rehab upon completion of his detox but declined any need for further treatment. He was only minimally interested in treatment for his mood disorder and didn't feel that he needed to be on anything sedating.          Upon completion of detox the patient was both and mentally and medically stable for discharge.  Consults:  None  Significant Diagnostic Studies:  labs: CBC, CMP, UA, UDS. BAL  Discharge Vitals:   Blood pressure 120/75, pulse 82, temperature 97.3 F (36.3 C), temperature source Oral, resp. rate 16, height 5\' 7"  (1.702 m), weight 86.637 kg (191 lb). Body mass index is 29.91 kg/(m^2). Lab Results:   Results for orders placed during the hospital encounter of 06/15/13 (from the past 72 hour(s))  HEPATITIS C ANTIBODY     Status: Abnormal   Collection Time    06/16/13  7:13 PM      Result Value Range   HCV Ab Reactive (*) NEGATIVE   Comment: (NOTE)  This test is for screening purposes only.  Reactive results should be     confirmed by an alternative method.  Suggest HCV Qualitative, PCR,     test code 16109.  Specimens will be stable for reflex testing up to 3     days after collection.     Performed at [facility]  HEPATITIS B SURFACE ANTIGEN     Status: None   Collection Time    06/16/13  7:13 PM      Result Value Range   Hepatitis B Surface Ag NEGATIVE  NEGATIVE   Comment: Performed at [facility]    Physical Findings: AIMS: Facial and Oral Movements Muscles of Facial Expression: None, normal Lips and Perioral Area: None, normal Jaw: None, normal Tongue: None, normal,Extremity Movements Upper (arms, wrists, hands, fingers): None, normal Lower (legs, knees, ankles, toes): None, normal, Trunk Movements Neck,  shoulders, hips: None, normal, Overall Severity Severity of abnormal movements (highest score from questions above): None, normal Incapacitation due to abnormal movements: None, normal Patient's awareness of abnormal movements (rate only patient's report): No Awareness, Dental Status Current problems with teeth and/or dentures?: No Does patient usually wear dentures?: No  CIWA:  CIWA-Ar Total: 0 COWS:     Psychiatric Specialty Exam: See Psychiatric Specialty Exam and Suicide Risk Assessment completed by Attending Physician prior to discharge.  Discharge destination:  Home  Is patient on multiple antipsychotic therapies at discharge:  No   Has Patient had three or more failed trials of antipsychotic monotherapy by history:  No  Recommended Plan for Multiple Antipsychotic Therapies: NA     Medication List    STOP taking these medications       PRESCRIPTION MEDICATION      TAKE these medications     Indication   busPIRone 7.5 MG tablet  Commonly known as:  BUSPAR  Take 1 tablet (7.5 mg total) by mouth 2 (two) times daily. For anxiety.   Indication:  Anxiety Disorder     citalopram 20 MG tablet  Commonly known as:  CELEXA  Take 1 tablet (20 mg total) by mouth daily. For depression and anxiety.   Indication:  Depression     risperiDONE 1 MG tablet  Commonly known as:  RISPERDAL  Take 1 tablet (1 mg total) by mouth at bedtime. For mood stabilization.   Indication:  Manic-Depression, Easily Angered or Annoyed           Follow-up Information   Follow up with [facility] On 06/20/2013. (Arrive at 8:00 am promptly for admission screening, possible admission on this date so bring supply of medication and your belongings. Date given by French Ana.)    Contact information:   [address] [place], [place] [address] Phone: [phone] Fax: [phone]      Follow up with [facility]. (Walk in for medication management and therapy.  Walk in clinic is Monday - Friday 8 am -  3 pm.  )    Contact information:   [address] [place] [address] Phone: [phone] Fax: [phone]      Follow-up recommendations:   Activities: Resume activity as tolerated. Diet: Heart healthy low sodium diet Tests: Follow up testing will be determined by your out patient provider. Comments:    Total Discharge Time:  Less than 30 minutes.  Signed: MASHBURN,NEIL 06/19/2013, 2:06 PM  Patient is seen for psychiatric evaluation, suicidal risk assessment and case discussed with physician extender and formulated discharge treatment plan. Reviewed the information  documented and agree with the treatment plan.  Debany Vantol,JANARDHAHA R. 06/21/2013 6:24 PM

## 2013-06-19 NOTE — [facility] Suicide Risk Assessment (Signed)
Suicide Risk Assessment  Discharge Assessment     Demographic Factors:  Male, Adolescent or young adult, Low socioeconomic status and Unemployed  Mental Status Per Nursing Assessment::   On Admission:  NA  Current Mental Status by Physician: Patient appeared active, and cooperative. Patient has finding mood with the appropriate and right affect. Patient has been more talkative to than usual. Patient has denied suicidal or homicidal ideations intentions or plans. Patient has no evidence of psychotic symptoms. Patient is willing to follow up with outpatient psychiatric services and also residential substance abuse treatment program.  Loss Factors: Financial problems/change in socioeconomic status  Historical Factors: Family history of mental illness or substance abuse and Impulsivity  Risk Reduction Factors:   Sense of responsibility to family, Religious beliefs about death, Positive social support, Positive therapeutic relationship and Positive coping skills or problem solving skills  Continued Clinical Symptoms:  Depression:   Comorbid alcohol abuse/dependence Impulsivity Recent sense of peace/wellbeing Alcohol/Substance Abuse/Dependencies Personality Disorders:   Cluster B Comorbid alcohol abuse/dependence Unstable or Poor Therapeutic Relationship Previous Psychiatric Diagnoses and Treatments  Cognitive Features That Contribute To Risk:  Polarized thinking    Suicide Risk:  Minimal: No identifiable suicidal ideation.  Patients presenting with no risk factors but with morbid ruminations; may be classified as minimal risk based on the severity of the depressive symptoms  Discharge Diagnoses:   AXIS I:  Substance Induced Mood Disorder and Alcohol dependence and cocaine abuse versus dependence AXIS II:  Cluster B Traits AXIS III:   Past Medical History  Diagnosis Date  . Fx ankle   . Collar bone fracture   . Appendicitis   . Hepatitis C    AXIS IV:  economic problems,  occupational problems, other psychosocial or environmental problems, problems related to social environment and problems with primary support group AXIS V:  51-60 moderate symptoms  Plan Of Care/Follow-up recommendations:  Activity:  As tolerated Diet:  Regular  Is patient on multiple antipsychotic therapies at discharge:  No   Has Patient had three or more failed trials of antipsychotic monotherapy by history:  No  Recommended Plan for Multiple Antipsychotic Therapies: NA  Fernando [name]., M.D. 06/19/2013, 12:14 PM

## 2013-06-19 NOTE — [facility] Group Notes (Signed)
[facility] LCSW Aftercare Discharge Planning Group Note   06/19/2013 8:45 AM  Participation Quality:  Alert and Appropriate   Mood/Affect:  Appropriate and Bright  Depression Rating:  0  Anxiety Rating:  0  Thoughts of Suicide:  Pt denies SI/HI  Will you contract for safety?   Yes  Current AVH:  Pt denies  Plan for Discharge/Comments:  Pt attended discharge planning group and actively participated in group.  CSW provided pt with today's workbook.  Pt reports feeling stable to d/c today and feels "excellent" and contributes it to his medications.  Pt states that he plans to stay with a friend in [place] before going to [facility] tomorrow for further inpatient treatment.  Pt also has follow up with [facility] for medication management and therapy, if he doesn't get into [facility].  CSW explained this process and pt's back up with [facility].  No further needs voiced by pt at this time.    Transportation Means: Pt reports access to transportation - friend will pick pt up  Supports: No supports mentioned at this time  Reyes Ivan, LCSWA 06/19/2013 10:02 AM

## 2013-06-24 NOTE — Progress Notes (Signed)
Patient Discharge Instructions:  After Visit Summary (AVS):   Faxed to:  06/24/13 Discharge Summary Note:   Faxed to:  06/24/13 Psychiatric Admission Assessment Note:   Faxed to:  06/24/13 Suicide Risk Assessment - Discharge Assessment:   Faxed to:  06/24/13 Faxed/Sent to the Next Level Care provider:  06/24/13 Faxed to [facility] @ [phone] Faxed to [facility] @ [phone]  Jerelene Redden, 06/24/2013, 2:47 PM

## 2013-10-29 ENCOUNTER — Emergency Department (HOSPITAL_COMMUNITY)
Admission: EM | Admit: 2013-10-29 | Discharge: 2013-10-30 | Disposition: A | Discharge status: Other Acute Inpatient Hospital | Payer: Self-pay | Attending: Emergency Medicine | Admitting: Emergency Medicine

## 2013-10-29 ENCOUNTER — Encounter (HOSPITAL_COMMUNITY): Payer: Self-pay | Admitting: Emergency Medicine

## 2013-10-29 DIAGNOSIS — Z8619 Personal history of other infectious and parasitic diseases: Secondary | ICD-10-CM | POA: Insufficient documentation

## 2013-10-29 DIAGNOSIS — F411 Generalized anxiety disorder: Secondary | ICD-10-CM | POA: Insufficient documentation

## 2013-10-29 DIAGNOSIS — F17200 Nicotine dependence, unspecified, uncomplicated: Secondary | ICD-10-CM | POA: Insufficient documentation

## 2013-10-29 DIAGNOSIS — R440 Auditory hallucinations: Secondary | ICD-10-CM

## 2013-10-29 DIAGNOSIS — F1010 Alcohol abuse, uncomplicated: Secondary | ICD-10-CM | POA: Diagnosis present

## 2013-10-29 DIAGNOSIS — Z8719 Personal history of other diseases of the digestive system: Secondary | ICD-10-CM | POA: Insufficient documentation

## 2013-10-29 DIAGNOSIS — F32A Depression, unspecified: Secondary | ICD-10-CM

## 2013-10-29 DIAGNOSIS — R443 Hallucinations, unspecified: Secondary | ICD-10-CM | POA: Insufficient documentation

## 2013-10-29 DIAGNOSIS — F419 Anxiety disorder, unspecified: Secondary | ICD-10-CM

## 2013-10-29 DIAGNOSIS — Z09 Encounter for follow-up examination after completed treatment for conditions other than malignant neoplasm: Secondary | ICD-10-CM

## 2013-10-29 DIAGNOSIS — F332 Major depressive disorder, recurrent severe without psychotic features: Secondary | ICD-10-CM

## 2013-10-29 DIAGNOSIS — Z79899 Other long term (current) drug therapy: Secondary | ICD-10-CM | POA: Insufficient documentation

## 2013-10-29 DIAGNOSIS — R45851 Suicidal ideations: Secondary | ICD-10-CM | POA: Insufficient documentation

## 2013-10-29 DIAGNOSIS — F909 Attention-deficit hyperactivity disorder, unspecified type: Secondary | ICD-10-CM | POA: Insufficient documentation

## 2013-10-29 DIAGNOSIS — F3289 Other specified depressive episodes: Secondary | ICD-10-CM | POA: Insufficient documentation

## 2013-10-29 DIAGNOSIS — Z9289 Personal history of other medical treatment: Secondary | ICD-10-CM

## 2013-10-29 DIAGNOSIS — Z8781 Personal history of (healed) traumatic fracture: Secondary | ICD-10-CM | POA: Insufficient documentation

## 2013-10-29 DIAGNOSIS — F329 Major depressive disorder, single episode, unspecified: Secondary | ICD-10-CM | POA: Insufficient documentation

## 2013-10-29 DIAGNOSIS — F1020 Alcohol dependence, uncomplicated: Secondary | ICD-10-CM

## 2013-10-29 LAB — COMPREHENSIVE METABOLIC PANEL
ALBUMIN: 4.2 g/dL (ref 3.5–5.2)
ALK PHOS: 61 U/L (ref 39–117)
ALT: 75 U/L — AB (ref 0–53)
AST: 36 U/L (ref 0–37)
BUN: 10 mg/dL (ref 6–23)
CO2: 25 mEq/L (ref 19–32)
Calcium: 9.5 mg/dL (ref 8.4–10.5)
Chloride: 103 mEq/L (ref 96–112)
Creatinine, Ser: 0.89 mg/dL (ref 0.50–1.35)
GFR calc Af Amer: >90 mL/min (ref >90)
GFR calc non Af Amer: >90 mL/min (ref >90)
Glucose, Bld: 67 mg/dL — ABNORMAL LOW (ref 70–99)
POTASSIUM: 4.1 meq/L (ref 3.7–5.3)
SODIUM: 141 meq/L (ref 137–147)
TOTAL PROTEIN: 7.8 g/dL (ref 6.0–8.3)

## 2013-10-29 LAB — RAPID URINE DRUG SCREEN, HOSP PERFORMED
AMPHETAMINES: NOT DETECTED
BARBITURATES: NOT DETECTED
BENZODIAZEPINES: NOT DETECTED
COCAINE: NOT DETECTED
Opiates: NOT DETECTED
TETRAHYDROCANNABINOL: POSITIVE — AB

## 2013-10-29 LAB — CBC
HCT: 44.5 % (ref 39.0–52.0)
Hemoglobin: 15.7 g/dL (ref 13.0–17.0)
MCH: 31.7 pg (ref 26.0–34.0)
MCHC: 35.3 g/dL (ref 30.0–36.0)
MCV: 89.7 fL (ref 78.0–100.0)
Platelets: 208 10*3/uL (ref 150–400)
RBC: 4.96 MIL/uL (ref 4.22–5.81)
RDW: 12.8 % (ref 11.5–15.5)
WBC: 9.2 10*3/uL (ref 4.0–10.5)

## 2013-10-29 LAB — ETHANOL: Alcohol, Ethyl (B): <11 mg/dL (ref 0–11)

## 2013-10-29 LAB — ACETAMINOPHEN LEVEL

## 2013-10-29 LAB — SALICYLATE LEVEL: Salicylate Lvl: <2 mg/dL — ABNORMAL LOW (ref 2.8–20.0)

## 2013-10-29 MED ORDER — IBUPROFEN 200 MG PO TABS: 600.0000 mg | ORAL_TABLET | Freq: Three times a day (TID) | ORAL | Status: DC | PRN

## 2013-10-29 MED ORDER — ALUM & MAG HYDROXIDE-SIMETH 200-200-20 MG/5ML PO SUSP: 30.0000 mL | ORAL | Status: DC | PRN

## 2013-10-29 MED ORDER — ONDANSETRON HCL 4 MG PO TABS
4.0000 mg | ORAL_TABLET | Freq: Three times a day (TID) | ORAL | Status: DC | PRN
  Administered 2013-10-29: 4 mg via ORAL
  Filled 2013-10-29: qty 1

## 2013-10-29 MED ORDER — LORAZEPAM 1 MG PO TABS
1.0000 mg | ORAL_TABLET | Freq: Three times a day (TID) | ORAL | Status: DC | PRN
  Administered 2013-10-29 – 2013-10-30 (×2): 1 mg via ORAL
  Filled 2013-10-29 (×2): qty 1

## 2013-10-29 MED ORDER — NICOTINE 21 MG/24HR TD PT24
21.0000 mg | MEDICATED_PATCH | Freq: Every day | TRANSDERMAL | Status: DC
  Administered 2013-10-29 – 2013-10-30 (×2): 21 mg via TRANSDERMAL
  Filled 2013-10-29 (×3): qty 1

## 2013-10-29 MED ORDER — ACETAMINOPHEN 325 MG PO TABS: 650.0000 mg | ORAL_TABLET | ORAL | Status: DC | PRN

## 2013-10-29 MED ORDER — ZOLPIDEM TARTRATE 10 MG PO TABS
10.0000 mg | ORAL_TABLET | Freq: Every evening | ORAL | Status: DC | PRN
  Administered 2013-10-29: 10 mg via ORAL
  Filled 2013-10-29: qty 1

## 2013-10-29 NOTE — ED Provider Notes (Signed)
CSN: [id number]     Arrival date & time 10/29/13  1826 History   First MD Initiated Contact with Patient 10/29/13 1857     Chief Complaint  Patient presents with  . Medical Clearance     (Consider location/radiation/quality/duration/timing/severity/associated sxs/prior Treatment) HPI Patient reports he suffers from depression and anxiety since 2003 when he fractured his foot. He reports he was admitted [facility] in December for 2 days. He reports at that time they changed his medication. He states they increased his BuSpar and his Celexa and also added Risperdal which he feels has made him start to have auditory hallucinations. He states he took the new dosage regimen for about a week and then he stopped taking the medicine about a week ago because of the auditory hallucinations. He states the voices are getting more persistent and today they told him to kill himself although he does not want to kill himself. He states today he went only down on the road so he can get run over and his friends came by and found him. He states he has an alcohol problem. He states he would drink as much as he can get a hold of. He will not quantify how much that is. He will say that the minimum he likes to drink as a 12 pack a day. He denies any physical problem other than some diffuse abdominal pain but he wakes up with in the morning and is usually resolved with drinking more alcohol. He states he has nausea and had one episode of vomiting this morning.  PCP none  Past Medical History  Diagnosis Date  . Fx ankle   . Collar bone fracture   . Appendicitis   . Hepatitis C    Past Surgical History  Procedure Laterality Date  . Appendectomy     No family history on file. History  Substance Use Topics  . Smoking status: Current Every Day Smoker -- 1.00 packs/day for 19 years    Types: Cigarettes  . Smokeless tobacco: Never Used  . Alcohol Use: 4.2 oz/week    7 Cans of beer per week   homeless Patient applying for disability Patient states he drinks a minimum of 12 beers a day. Patient's last cocaine was a month ago  Review of Systems  All other systems reviewed and are negative.      Allergies  Review of patient's allergies indicates no known allergies.  Home Medications   Current Outpatient Rx  Name  Route  Sig  Dispense  Refill  . busPIRone (BUSPAR) 7.5 MG tablet   Oral   Take 1 tablet (7.5 mg total) by mouth 2 (two) times daily. For anxiety.   60 tablet   0   . citalopram (CELEXA) 20 MG tablet   Oral   Take 1 tablet (20 mg total) by mouth daily. For depression and anxiety.   30 tablet   0   . risperiDONE (RISPERDAL) 1 MG tablet   Oral   Take 1 tablet (1 mg total) by mouth at bedtime. For mood stabilization.   30 tablet   0    BP 155/84  Pulse 100  Temp(Src) 97.8 F (36.6 C) (Oral)  Resp 20  SpO2 100%  Vital signs normal except borderline tachycardia  Physical Exam  Nursing note and vitals reviewed. Constitutional: He is oriented to person, place, and time. He appears well-developed and well-nourished.  Non-toxic appearance. He does not appear ill. No distress.  HENT:  Head:  Normocephalic and atraumatic.  Right Ear: External ear normal.  Left Ear: External ear normal.  Nose: Nose normal. No mucosal edema or rhinorrhea.  Mouth/Throat: Oropharynx is clear and moist and mucous membranes are normal. No dental abscesses or uvula swelling.  Eyes: Conjunctivae and EOM are normal. Pupils are equal, round, and reactive to light.  Neck: Normal range of motion and full passive range of motion without pain. Neck supple.  Cardiovascular: Normal rate, regular rhythm and normal heart sounds.  Exam reveals no gallop and no friction rub.   No murmur heard. Pulmonary/Chest: Effort normal and breath sounds normal. No respiratory distress. He has no wheezes. He has no rhonchi. He has no rales. He exhibits no tenderness and no crepitus.  Abdominal:  Soft. Normal appearance and bowel sounds are normal. He exhibits no distension. There is no tenderness. There is no rebound and no guarding.  Musculoskeletal: Normal range of motion. He exhibits no edema and no tenderness.  Moves all extremities well.   Neurological: He is alert and oriented to person, place, and time. He has normal strength. No cranial nerve deficit.  Skin: Skin is warm, dry and intact. No rash noted. No erythema. No pallor.  Psychiatric: His mood appears anxious. His affect is labile. His speech is rapid and/or pressured. He is hyperactive. He expresses suicidal ideation.  Patient becomes tearful and states he doesn't want to kill himself.    ED Course  Procedures (including critical care time)   Pt placed in psych hold.   23:15 pt waiting for TSS evaluation.    Labs Review Results for orders placed during the hospital encounter of 10/29/13  ACETAMINOPHEN LEVEL      Result Value Ref Range   Acetaminophen (Tylenol), Serum <15.0  10 - 30 ug/mL  CBC      Result Value Ref Range   WBC 9.2  4.0 - 10.5 K/uL   RBC 4.96  4.22 - 5.81 MIL/uL   Hemoglobin 15.7  13.0 - 17.0 g/dL   HCT 18.8  41.6 - 60.6 %   MCV 89.7  78.0 - 100.0 fL   MCH 31.7  26.0 - 34.0 pg   MCHC 35.3  30.0 - 36.0 g/dL   RDW 30.1  60.1 - 09.3 %   Platelets 208  150 - 400 K/uL  COMPREHENSIVE METABOLIC PANEL      Result Value Ref Range   Sodium 141  137 - 147 mEq/L   Potassium 4.1  3.7 - 5.3 mEq/L   Chloride 103  96 - 112 mEq/L   CO2 25  19 - 32 mEq/L   Glucose, Bld 67 (*) 70 - 99 mg/dL   BUN 10  6 - 23 mg/dL   Creatinine, Ser 2.35  0.50 - 1.35 mg/dL   Calcium 9.5  8.4 - 57.3 mg/dL   Total Protein 7.8  6.0 - 8.3 g/dL   Albumin 4.2  3.5 - 5.2 g/dL   AST 36  0 - 37 U/L   ALT 75 (*) 0 - 53 U/L   Alkaline Phosphatase 61  39 - 117 U/L   Total Bilirubin <0.2 (*) 0.3 - 1.2 mg/dL   GFR calc non Af Amer >90  >90 mL/min   GFR calc Af Amer >90  >90 mL/min  ETHANOL      Result Value Ref Range    Alcohol, Ethyl (B) <11  0 - 11 mg/dL  SALICYLATE LEVEL      Result Value Ref Range   Salicylate  Lvl <2.0 (*) 2.8 - 20.0 mg/dL   Laboratory interpretation all normal  Imaging Review No results found.  EKG Interpretation   None       MDM   Final diagnoses:  Alcoholism  Anxiety  Depression  Auditory hallucinations     Deposition pending  Devoria Albe, MD, Armando Gang    Ward Givens, MD 10/29/13 2314

## 2013-10-29 NOTE — ED Notes (Signed)
Patient presents to secured area of ED escorted by nurse; gait steady. Pt anxious; cooperative with assessment.  He is hyper-verbal.  Patient stated has been tearful most of the day.  He requested the lights or tv remain on because he is claustrophobic.  Rep[place] where he was prescribed Risperadol.  Reports the risperadol caused him to sleep excessively and made him difficult to awake.  States he discontinued the medication after "looking it up on line to find it was for schizophrenics". He states he is not schizophrenic only "depressed...medication caused him to hear voices".  He stated he "wants to stay here until he gets the help he needs".

## 2013-10-29 NOTE — ED Notes (Addendum)
Pt states he has not been taking his meds for depression because he thought he was ok, now states he is very depressed, having thoughts of suicide, laid in the road tryng to die. Pt tearful at the moment.

## 2013-10-30 ENCOUNTER — Inpatient Hospital Stay (HOSPITAL_COMMUNITY)
Admission: AD | Admit: 2013-10-30 | Discharge: 2013-11-12 | DRG: 897 | Disposition: A | Discharge status: Home / Self Care | Payer: Federal, State, Local not specified - Other | Source: Intra-hospital | Attending: Psychiatry | Admitting: Psychiatry

## 2013-10-30 ENCOUNTER — Encounter (HOSPITAL_COMMUNITY): Payer: Self-pay | Admitting: Registered Nurse

## 2013-10-30 ENCOUNTER — Encounter (HOSPITAL_COMMUNITY): Payer: Self-pay | Admitting: *Deleted

## 2013-10-30 DIAGNOSIS — F17200 Nicotine dependence, unspecified, uncomplicated: Secondary | ICD-10-CM | POA: Diagnosis present

## 2013-10-30 DIAGNOSIS — F1010 Alcohol abuse, uncomplicated: Secondary | ICD-10-CM | POA: Diagnosis present

## 2013-10-30 DIAGNOSIS — F1020 Alcohol dependence, uncomplicated: Principal | ICD-10-CM | POA: Diagnosis present

## 2013-10-30 DIAGNOSIS — F39 Unspecified mood [affective] disorder: Secondary | ICD-10-CM | POA: Diagnosis present

## 2013-10-30 DIAGNOSIS — Z09 Encounter for follow-up examination after completed treatment for conditions other than malignant neoplasm: Secondary | ICD-10-CM

## 2013-10-30 DIAGNOSIS — Z9289 Personal history of other medical treatment: Secondary | ICD-10-CM

## 2013-10-30 DIAGNOSIS — Z87820 Personal history of traumatic brain injury: Secondary | ICD-10-CM

## 2013-10-30 DIAGNOSIS — F0630 Mood disorder due to known physiological condition, unspecified: Secondary | ICD-10-CM

## 2013-10-30 DIAGNOSIS — Z5900 Homelessness unspecified: Secondary | ICD-10-CM

## 2013-10-30 DIAGNOSIS — R45851 Suicidal ideations: Secondary | ICD-10-CM

## 2013-10-30 DIAGNOSIS — F1420 Cocaine dependence, uncomplicated: Secondary | ICD-10-CM | POA: Diagnosis present

## 2013-10-30 DIAGNOSIS — F410 Panic disorder [episodic paroxysmal anxiety] without agoraphobia: Secondary | ICD-10-CM | POA: Diagnosis present

## 2013-10-30 DIAGNOSIS — T1490XA Injury, unspecified, initial encounter: Secondary | ICD-10-CM

## 2013-10-30 DIAGNOSIS — F1994 Other psychoactive substance use, unspecified with psychoactive substance-induced mood disorder: Secondary | ICD-10-CM | POA: Diagnosis present

## 2013-10-30 DIAGNOSIS — F332 Major depressive disorder, recurrent severe without psychotic features: Secondary | ICD-10-CM

## 2013-10-30 DIAGNOSIS — M62838 Other muscle spasm: Secondary | ICD-10-CM | POA: Diagnosis present

## 2013-10-30 DIAGNOSIS — F411 Generalized anxiety disorder: Secondary | ICD-10-CM | POA: Diagnosis present

## 2013-10-30 MED ORDER — IBUPROFEN 600 MG PO TABS
600.0000 mg | ORAL_TABLET | Freq: Three times a day (TID) | ORAL | Status: DC | PRN
  Administered 2013-10-30 – 2013-11-04 (×7): 600 mg via ORAL
  Filled 2013-10-30 (×7): qty 1

## 2013-10-30 MED ORDER — THIAMINE HCL 100 MG/ML IJ SOLN
100.0000 mg | Freq: Once | INTRAMUSCULAR | Status: AC
Start: 2013-10-30 — End: 2013-10-30
  Administered 2013-10-30: 100 mg via INTRAMUSCULAR
  Filled 2013-10-30: qty 2

## 2013-10-30 MED ORDER — NICOTINE 21 MG/24HR TD PT24
21.0000 mg | MEDICATED_PATCH | Freq: Every day | TRANSDERMAL | Status: DC
  Administered 2013-10-31 – 2013-11-12 (×13): 21 mg via TRANSDERMAL
  Filled 2013-10-30 (×14): qty 1

## 2013-10-30 MED ORDER — LOPERAMIDE HCL 2 MG PO CAPS: 2.0000 mg | ORAL_CAPSULE | ORAL | Status: DC | PRN

## 2013-10-30 MED ORDER — THIAMINE HCL 100 MG/ML IJ SOLN: 100.0000 mg | Freq: Once | INTRAMUSCULAR | Status: DC

## 2013-10-30 MED ORDER — LOPERAMIDE HCL 2 MG PO CAPS: 2.0000 mg | ORAL_CAPSULE | ORAL | Status: AC | PRN

## 2013-10-30 MED ORDER — CHLORDIAZEPOXIDE HCL 25 MG PO CAPS
25.0000 mg | ORAL_CAPSULE | Freq: Three times a day (TID) | ORAL | Status: AC
  Administered 2013-11-01 (×3): 25 mg via ORAL
  Filled 2013-10-30 (×3): qty 1

## 2013-10-30 MED ORDER — ONDANSETRON HCL 4 MG PO TABS: 4.0000 mg | ORAL_TABLET | Freq: Three times a day (TID) | ORAL | Status: DC | PRN

## 2013-10-30 MED ORDER — HYDROXYZINE HCL 25 MG PO TABS
25.0000 mg | ORAL_TABLET | Freq: Four times a day (QID) | ORAL | Status: AC | PRN
  Administered 2013-10-30 – 2013-10-31 (×2): 25 mg via ORAL
  Filled 2013-10-30 (×3): qty 1

## 2013-10-30 MED ORDER — CHLORDIAZEPOXIDE HCL 25 MG PO CAPS
25.0000 mg | ORAL_CAPSULE | Freq: Every day | ORAL | Status: AC
  Administered 2013-11-03: 25 mg via ORAL
  Filled 2013-10-30: qty 1

## 2013-10-30 MED ORDER — CHLORDIAZEPOXIDE HCL 25 MG PO CAPS: 25.0000 mg | ORAL_CAPSULE | Freq: Four times a day (QID) | ORAL | Status: DC | PRN

## 2013-10-30 MED ORDER — CHLORDIAZEPOXIDE HCL 25 MG PO CAPS
25.0000 mg | ORAL_CAPSULE | ORAL | Status: AC
  Administered 2013-11-02 (×2): 25 mg via ORAL
  Filled 2013-10-30 (×2): qty 1

## 2013-10-30 MED ORDER — ALUM & MAG HYDROXIDE-SIMETH 200-200-20 MG/5ML PO SUSP
30.0000 mL | ORAL | Status: DC | PRN
  Administered 2013-11-09 – 2013-11-10 (×2): 30 mL via ORAL

## 2013-10-30 MED ORDER — VITAMIN B-1 100 MG PO TABS: 100.0000 mg | ORAL_TABLET | Freq: Every day | ORAL | Status: DC

## 2013-10-30 MED ORDER — CHLORDIAZEPOXIDE HCL 25 MG PO CAPS: 25.0000 mg | ORAL_CAPSULE | Freq: Three times a day (TID) | ORAL | Status: DC

## 2013-10-30 MED ORDER — ADULT MULTIVITAMIN W/MINERALS CH
1.0000 | ORAL_TABLET | Freq: Every day | ORAL | Status: DC
  Administered 2013-10-31 – 2013-11-05 (×6): 1 via ORAL
  Administered 2013-11-06: 08:00:00 via ORAL
  Administered 2013-11-07 – 2013-11-12 (×6): 1 via ORAL
  Filled 2013-10-30 (×14): qty 1

## 2013-10-30 MED ORDER — ONDANSETRON 4 MG PO TBDP: 4.0000 mg | ORAL_TABLET | Freq: Four times a day (QID) | ORAL | Status: AC | PRN

## 2013-10-30 MED ORDER — ACETAMINOPHEN 325 MG PO TABS
650.0000 mg | ORAL_TABLET | ORAL | Status: DC | PRN
  Administered 2013-11-02 – 2013-11-04 (×3): 650 mg via ORAL
  Filled 2013-10-30 (×4): qty 2

## 2013-10-30 MED ORDER — CHLORDIAZEPOXIDE HCL 25 MG PO CAPS
25.0000 mg | ORAL_CAPSULE | Freq: Once | ORAL | Status: AC
  Administered 2013-10-30: 25 mg via ORAL
  Filled 2013-10-30: qty 1

## 2013-10-30 MED ORDER — CHLORDIAZEPOXIDE HCL 25 MG PO CAPS
25.0000 mg | ORAL_CAPSULE | Freq: Four times a day (QID) | ORAL | Status: AC | PRN
  Filled 2013-10-30 (×2): qty 1

## 2013-10-30 MED ORDER — CHLORDIAZEPOXIDE HCL 25 MG PO CAPS: 25.0000 mg | ORAL_CAPSULE | Freq: Every day | ORAL | Status: DC

## 2013-10-30 MED ORDER — ONDANSETRON 4 MG PO TBDP: 4.0000 mg | ORAL_TABLET | Freq: Four times a day (QID) | ORAL | Status: DC | PRN

## 2013-10-30 MED ORDER — CHLORDIAZEPOXIDE HCL 25 MG PO CAPS: 25.0000 mg | ORAL_CAPSULE | Freq: Four times a day (QID) | ORAL | Status: DC

## 2013-10-30 MED ORDER — MAGNESIUM HYDROXIDE 400 MG/5ML PO SUSP: 30.0000 mL | Freq: Every day | ORAL | Status: DC | PRN

## 2013-10-30 MED ORDER — ADULT MULTIVITAMIN W/MINERALS CH
1.0000 | ORAL_TABLET | Freq: Every day | ORAL | Status: DC
Start: 2013-10-30 — End: 2013-10-30

## 2013-10-30 MED ORDER — CHLORDIAZEPOXIDE HCL 25 MG PO CAPS: 25.0000 mg | ORAL_CAPSULE | Freq: Once | ORAL | Status: DC

## 2013-10-30 MED ORDER — CHLORDIAZEPOXIDE HCL 25 MG PO CAPS: 25.0000 mg | ORAL_CAPSULE | ORAL | Status: DC

## 2013-10-30 MED ORDER — CHLORDIAZEPOXIDE HCL 25 MG PO CAPS
25.0000 mg | ORAL_CAPSULE | Freq: Four times a day (QID) | ORAL | Status: AC
  Administered 2013-10-30 – 2013-10-31 (×6): 25 mg via ORAL
  Filled 2013-10-30 (×4): qty 1

## 2013-10-30 MED ORDER — HYDROXYZINE HCL 25 MG PO TABS: 25.0000 mg | ORAL_TABLET | Freq: Four times a day (QID) | ORAL | Status: DC | PRN

## 2013-10-30 MED ORDER — VITAMIN B-1 100 MG PO TABS
100.0000 mg | ORAL_TABLET | Freq: Every day | ORAL | Status: DC
  Administered 2013-10-31 – 2013-11-12 (×13): 100 mg via ORAL
  Filled 2013-10-30 (×14): qty 1

## 2013-10-30 NOTE — BHH Counselor (Signed)
Adult Psychosocial Assessment Update Interdisciplinary Team  Previous [facility] admissions/discharges:  Admissions Discharges  Date: 10/30/13 Date: unknown d/c at this time.   Date: 06/15/13 Date: 06/19/13  Date: Date:  Date: Date:  Date: Date:   Changes since the last Psychosocial Assessment (including adherence to outpatient mental health and/or substance abuse treatment, situational issues contributing to decompensation and/or relapse). Fernando GibbonsLawrence [name] is an 38 y.o. male. [facility]. Pt sts he is very depressed. He says he laid in the street last night because he wanted to die. Pt denies prior suicide attempts. Pt has been hospitalized 4 times in [place] and [facility]. Pt describes himself as having a "drinking problem". Pt sts he drinks approx. one gallon of liquor daily. Last alcohol use was 10/28/13. Pt says he came to [place] from [place] to visit a friend who has been sober for 10 years. Pt sts he has 4 DWIs. Pt sts he often has panic attacks. Pt endorses depressed mood with hopelessness, "helplessness", insomnia, worthlessness. Pt denies hx of seizures from alcohol. Pt currently denies SI and he denies HI. Pt sts he takes Risperdal, celexa, and buspar. He says Dr. Alfred LevinsFunk(?) in [place] prescribes his psych meds. He says he never has heard voices until he began taking these psych meds. Pt reports the voices say "kill yourself, kill yourself". Pt denies plan to hurt or kill herself. Pt sts he took "3 hits off a blunt" of marijuana a couple of days ago b/c he felt so depressed. Pt says he rarely uses marijuana. Pt sts he broke his foot in 2009 and "I haven't been able to work since then or take care of my son". Pt says son is 38 yo. Pt is polite and has good insight.             Discharge Plan 1. Will you be returning to the same living situation after discharge?   Yes: UNKNOWN No:      If no, what is your plan?    Pt just  arrived on unit. He is currently unsure what his aftercare plan/goals are. Pt thinking about i/p vs o/p treatment and states that his medications must be adjusted because he had been hearing voices and endorsing SI-laid in street in attempt to get run over.        2. Would you like a referral for services when you are discharged? Yes:     If yes, for what services?  No:       Pt reports non compliance with medications and is in need of med management and therapy. Pt thinking about inpatient treatment but has not decided on this yet.        Summary and Recommendations (to be completed by the evaluator) Pt is 38 year old male living in [place], [place] [facility] for ETOH detox, mood stabilization-depression, medication management, and SI with plan to get run over. Recommendations for pt include: crisis stabilization, therapeutic milieu, encourage group attendance and participation, librium taper for withdrawals, medication management for mood stabilization, and development of comprehensive mental wellness/sobreity plan. Pt exploring options with CSW at this time and CSW assessing for appropriate referrals.                        Signature:  Micah NoelSmart, Verneda Hollopeter, LCSWA 10/30/2013 2:36 PM

## 2013-10-30 NOTE — BH Assessment (Signed)
BHH Assessment Progress Note   Pt was given ativan around 23:00 to address complaint of sleeplessness.  Patient is too drowsy to participate in assessment at the current time.  Will attempt again after 03:00.

## 2013-10-30 NOTE — Progress Notes (Signed)
Recreation Therapy Notes  Date: 02.25.2015  Time: 2:45pm Location: 500 Hall Dayroom   Group Topic: Understanding mental health   Goal Area(s) Addresses:  Patient will identify benefits to understanding mental health. Patient will work effectively with teammates.   Behavioral Response: Engaged, Attentive  Intervention: Game  Activity: Mental Health Jeopardy. Patients were divided into groups of 4 -5 patients, working as a team patients were asked to select a category of questions and a point value. Categories included: Symptoms, Medications, Causes, Coping Skills, and Substance Abuse.   Education: Wellness, Discharge Planning   Education Outcome: Acknowledges understanding  Clinical Observations/Feedback: Patient actively engaged in group activity, working well with peers. Patient assisted teammates with answering questions. Of particular interest to group, including patient was the genetic link between substance abuse and mental illness. Patient shared other members of his family have struggled with addiction. Patient listened attentively as group members shared stories and asked questions.   Marykay Lexenise L Zaccai Chavarin, LRT/CTRS        Jearl KlinefelterBlanchfield, Densil Ottey L 10/30/2013 4:41 PM

## 2013-10-30 NOTE — BH Assessment (Addendum)
Assessment Note  Fernando [name] is an 38 y.o. male.  Pt presents voluntarily to [facility] 10/29/13. Pt sts he is very depressed. He says he laid in the street last night because he wanted to die. Pt denies prior suicide attempts. Pt has been hospitalized 4 times in [place] and once at [facility] in Oct 2014. Pt describes himself as having a "drinking problem". Pt sts he drinks approx. one gallon of liquor daily. Last alcohol use was 10/28/13. Pt says he came to [place] from [place] to visit a friend who has been sober for 10 years. Pt sts he has 4 DWIs. Pt sts he often has panic attacks. Pt endorses depressed mood with hopelessness, "helplessness", insomnia, worthlessness. Pt denies hx of seizures from alcohol. Pt currently denies SI and he denies HI. Pt sts he takes Risperdal, celexa, and buspar. He says Dr. Alfred Levins(?) in [place] prescribes his psych meds. He says he never has heard voices until he began taking these psych meds. Pt reports the voices say "kill yourself, kill yourself". Pt denies plan to hurt or kill herself.  Pt sts he took "3 hits off a blunt" of marijuana a couple of days ago b/c he felt so depressed. Pt says he rarely uses marijuana.  Pt sts he broke his foot in 2009 and "I haven't been able to work since then or take care of my son". Pt says son is 78 yo. Pt is polite and has good insight. Pt can't contract for safety.  Shuvon Rankin NP accepted to [facility] [address] hall. Thurman Coyer [facility] will give pt bed number later today.  Axis I: Major Depressive Disorder, Recurrent, Severe            Alcohol Use Disorder, Severe Axis II: Deferred Axis III:  Past Medical History  Diagnosis Date  . Fx ankle   . Collar bone fracture   . Appendicitis   . Hepatitis C    Axis IV: economic problems, housing problems, other psychosocial or environmental problems, problems related to social environment and problems with primary support group Axis V: 31-40 impairment in reality testing  Past Medical History:   Past Medical History  Diagnosis Date  . Fx ankle   . Collar bone fracture   . Appendicitis   . Hepatitis C     Past Surgical History  Procedure Laterality Date  . Appendectomy      Family History: No family history on file.  Social History:  reports that he has been smoking Cigarettes.  He has a 19 pack-year smoking history. He has never used smokeless tobacco. He reports that he drinks about 4.2 ounces of alcohol per week. He reports that he uses illicit drugs (Cocaine and Marijuana).  Additional Social History:  Alcohol / Drug Use Pain Medications: pt denies abuse and sts doesn't use psych meds Prescriptions: pt denies abuse Over the Counter: pt denies abuse History of alcohol / drug use?: Yes Longest period of sobriety (when/how long): 14 mos Negative Consequences of Use: Legal;Work / School;Financial Substance #1 Name of Substance 1: alcohol 1 - Age of First Use: 18 1 - Amount (size/oz): one gallon of liquor 1 - Frequency: daily 1 - Duration: several mos 1 - Last Use / Amount: 10/28/13 Substance #2 Name of Substance 2: marijuana 2 - Age of First Use: 20 2 - Amount (size/oz): varies 2 - Frequency: "rarely" 2 - Last Use / Amount: 10/29/13 - "three hits of a blunt"  CIWA: CIWA-Ar BP: 115/71 mmHg Pulse  Rate: 68 COWS:    Allergies: No Known Allergies  Home Medications:  (Not in a hospital admission)  OB/GYN Status:  No LMP for male patient.  General Assessment Data [facility] of Assessment: [facility] Is this a Tele or Face-to-Face Assessment?: Face-to-Face Is this an Initial Assessment or a Re-assessment for this encounter?: Initial Assessment Living Arrangements: Other (Comment);Non-relatives/Friends (homeless, with friends) Can pt return to current living arrangement?: Yes Admission Status: Voluntary Is patient capable of signing voluntary admission?: Yes Transfer from: Other (Comment) (friend's house) Referral Source: Self/Family/Friend     [facility]  Plan Living Arrangements: Other (Comment);Non-relatives/Friends (homeless, with friends)  Education Status Is patient currently in school?: No Highest grade of school patient has completed: 18 Name of school: [employer]  Risk to self Suicidal Ideation: No Suicidal Intent: No Is patient at risk for suicide?: No Suicidal Plan?: No Access to Means: Yes Specify Access to Suicidal Means: pt laid in street last night but denies SI today What has been your use of drugs/alcohol within the last 12 months?: daily alcohol use Previous Attempts/Gestures: Yes How many times?: 1 (laid in street last night) Other Self Harm Risks: none Triggers for Past Attempts:  (n/a) Intentional Self Injurious Behavior: None Family Suicide History: Yes (3 cousins committed suicide) Recent stressful life event(s): Other (Comment);Financial Problems (homelessness, AH from new psych meds) Persecutory voices/beliefs?: No Depression: Yes Depression Symptoms: Despondent;Insomnia;Loss of interest in usual pleasures;Feeling worthless/self pity Substance abuse history and/or treatment for substance abuse?: Yes Suicide prevention information given to non-admitted patients: Not applicable  Risk to Others Homicidal Ideation: No Thoughts of Harm to Others: No Current Homicidal Intent: No Current Homicidal Plan: No Access to Homicidal Means: No Identified Victim: none History of harm to others?: No Assessment of Violence: None Noted Violent Behavior Description: pt calm and cooperative - denies hx of violence Does patient have access to weapons?: No Criminal Charges Pending?: No Does patient have a court date: No  Psychosis Hallucinations: Auditory;With command Delusions: None noted  Mental Status Report Appear/Hygiene: Other (Comment) (appropriate) Eye Contact: Good Motor Activity: Freedom of movement Speech: Logical/coherent Level of Consciousness: Alert Mood: Anxious;Depressed;Sad Affect: Appropriate to  circumstance;Anxious Anxiety Level: Panic Attacks Panic attack frequency: once a week Most recent panic attack: 10/30/13 Thought Processes: Relevant;Coherent Judgement: Unimpaired Orientation: Person;Place;Time;Situation Obsessive Compulsive Thoughts/Behaviors: None  Cognitive Functioning Concentration: Normal Memory: Recent Intact;Remote Intact IQ: Average Insight: Good Impulse Control: Fair Appetite: Good Sleep: No Change Total Hours of Sleep: 4 Vegetative Symptoms: None  ADLScreening [facility]) Patient's cognitive ability adequate to safely complete daily activities?: Yes Patient able to express need for assistance with ADLs?: Yes Independently performs ADLs?: Yes (appropriate for developmental age)  Prior Inpatient Therapy Prior Inpatient Therapy: Yes Prior Therapy Dates: Oct 2014 and other years pt can't remember Prior Therapy Facilty/Provider(s): [facility] (Oct 2014) and [place] x 4 Reason for Treatment: depression, alcohol abuse  Prior Outpatient Therapy Prior Outpatient Therapy: Yes Prior Therapy Dates: 2014 Prior Therapy Facilty/Provider(s): community mental health  ADL Screening (condition at time of admission) Patient's cognitive ability adequate to safely complete daily activities?: Yes Is the patient deaf or have difficulty hearing?: No Does the patient have difficulty seeing, even when wearing glasses/contacts?: No Does the patient have difficulty concentrating, remembering, or making decisions?: No Patient able to express need for assistance with ADLs?: Yes Does the patient have difficulty dressing or bathing?: No Independently performs ADLs?: Yes (appropriate for developmental age) Does the patient have difficulty walking or climbing stairs?:  No Weakness of Legs: None Weakness of Arms/Hands: None  Home Assistive Devices/Equipment Home Assistive Devices/Equipment: None    Abuse/Neglect Assessment (Assessment to be complete while  patient is alone) Physical Abuse: Denies Verbal Abuse: Denies Sexual Abuse: Denies Exploitation of patient/patient's resources: Denies Self-Neglect: Denies Values / Beliefs Cultural Requests During Hospitalization: None Spiritual Requests During Hospitalization: None   Advance Directives (For Healthcare) Advance Directive: Patient does not have advance directive;Patient would not like information    Additional Information 1:1 In Past 12 Months?: No CIRT Risk: No Elopement Risk: No Does patient have medical clearance?: Yes     Disposition:  Disposition Initial Assessment Completed for this Encounter: Yes Disposition of Patient: Other dispositions Other disposition(s): Other (Comment) (pending psych eval [facility])  On Site Evaluation by:   Reviewed with Physician:    Donnamarie RossettiMCLEAN, Via Rosado P 10/30/2013 8:28 AM

## 2013-10-30 NOTE — Tx Team (Signed)
Initial Interdisciplinary Treatment Plan  PATIENT STRENGTHS: (choose at least two) Average or above average intelligence Capable of independent living  PATIENT STRESSORS: Financial difficulties Medication change or noncompliance Substance abuse   PROBLEM LIST: Problem List/Patient Goals Date to be addressed Date deferred Reason deferred Estimated date of resolution  depression 10/30/2013     Substance abuse 10/30/2013                                                DISCHARGE CRITERIA:  Improved stabilization in mood, thinking, and/or behavior Need for constant or close observation no longer present Withdrawal symptoms are absent or subacute and managed without 24-hour nursing intervention  PRELIMINARY DISCHARGE PLAN: Outpatient therapy Return to previous living arrangement  PATIENT/FAMIILY INVOLVEMENT: This treatment plan has been presented to and reviewed with the patient, Fernando GibbonsLawrence Apt.  The patient and family have been given the opportunity to ask questions and make suggestions.  Fernando [name], Fernando [name] 10/30/2013, 3:43 PM

## 2013-10-30 NOTE — Consult Note (Signed)
  Patient continues to endorse suicidal ideation.  Patient states that he drinks because he is depresses and feels that his medications did not work.  Patient states "I laid in the middle of the street last night and that really scared me.  I am tired of hearing the voices telling me to kill my self.  I never heard voices before.  I am not hearing them today."    Disposition:  Agree with previous disposition for inpatient detox treatment.  Will start Librium protocol for alcohol detox.    [facility].  Patient will be able to transport today.  Thurman CoyerEric Kaplan RN will call with bed/room number and when patient is to be transferred over.

## 2013-10-30 NOTE — Progress Notes (Signed)
[facility] CL provided pt with a [employer] Orange Card application, highlighting [facility].  °

## 2013-10-30 NOTE — BH Assessment (Signed)
BHH Assessment Progress Note   Clinician called back to see if patient were more alert & awake.  NT Marcelino DusterMichelle said that patient was still sleeping and difficult to wake.

## 2013-10-30 NOTE — Consult Note (Signed)
Face to face evaluation and I agree with this note 

## 2013-10-30 NOTE — BHH Counselor (Signed)
Support [facility]. [employer] placed in pt's chart. Per Carlisle BeersLuann RN, pt has bed 304-1.    Evette Cristalaroline Paige Felicity Penix, [place] Assessment Counselor

## 2013-10-30 NOTE — Progress Notes (Signed)
D: Patient in the dayroom interacting with peers on first approach.  Patient states he is depressed and states none of the medications he has been given are working.  Patient animated and flirtatious.  Patient states he is in the area because he came to visit someone in [place] and it did not work out.  Patient denies SI/HI and denies AVH. A: Staff to monitor Q 15 mins for safety.  Encouragement and support offered.  Scheduled medications administered per orders. R: Patient remains safe on the unit.  Patient attended group tonight.  Patient visible on the unit and interacting with peers.  Patient taking administered medications.

## 2013-10-30 NOTE — Progress Notes (Signed)
Patient ID: Fernando [name], male   DOB: 07/14/1976, 38 y.o.   MRN: [id number] Patient comes in after he said he laid in the road to harm himself; patient denies SI at this time but contracts for safety; patient reports etoh use and that he drinks half a gallon a day; patient etoh level was negative and uds was negative; patient reports that his medications was causing him to hear voices so he stopped taking them and he denies A/V hallucinations; patient calm, cooperative, and denying withdrawal symptoms at this time; patient reports that he cant work and he also talks about his grandmother who died years ago but he reports that he stills deal with that loss

## 2013-10-31 DIAGNOSIS — F1020 Alcohol dependence, uncomplicated: Secondary | ICD-10-CM | POA: Diagnosis present

## 2013-10-31 DIAGNOSIS — F39 Unspecified mood [affective] disorder: Secondary | ICD-10-CM | POA: Diagnosis present

## 2013-10-31 DIAGNOSIS — F411 Generalized anxiety disorder: Secondary | ICD-10-CM | POA: Diagnosis present

## 2013-10-31 MED ORDER — BUSPIRONE HCL 15 MG PO TABS
15.0000 mg | ORAL_TABLET | Freq: Two times a day (BID) | ORAL | Status: DC
  Administered 2013-10-31 – 2013-11-06 (×12): 15 mg via ORAL
  Filled 2013-10-31 (×15): qty 1
  Filled 2013-10-31: qty 3
  Filled 2013-10-31: qty 1

## 2013-10-31 MED ORDER — LORAZEPAM 1 MG PO TABS
2.0000 mg | ORAL_TABLET | Freq: Once | ORAL | Status: AC
  Administered 2013-10-31: 2 mg via ORAL
  Filled 2013-10-31: qty 2

## 2013-10-31 MED ORDER — CITALOPRAM HYDROBROMIDE 10 MG PO TABS
10.0000 mg | ORAL_TABLET | Freq: Every day | ORAL | Status: DC
  Administered 2013-10-31 – 2013-11-04 (×5): 10 mg via ORAL
  Filled 2013-10-31 (×8): qty 1

## 2013-10-31 MED ORDER — OLANZAPINE 5 MG PO TBDP
5.0000 mg | ORAL_TABLET | Freq: Three times a day (TID) | ORAL | Status: DC | PRN
  Administered 2013-10-31 – 2013-11-12 (×22): 5 mg via ORAL
  Filled 2013-10-31 (×24): qty 1

## 2013-10-31 NOTE — Progress Notes (Signed)
BHH Group Notes:  (Nursing/MHT/Case Management/Adjunct)  Date:  10/31/2013  Time:  2100 Type of Therapy:  wrap up group  Participation Level:  Minimal  Participation Quality:  Resistant and Sharing  Affect:  Irritable  Cognitive:  Appropriate  Insight:  Lacking  Engagement in Group:  Limited  Modes of Intervention:  Clarification, Education and Support  Summary of Progress/Problems:  Fernando [name], Fernando [name] 10/31/2013, 11:08 PM
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
## 2013-10-31 NOTE — Progress Notes (Signed)
D:  Patient's self inventory sheet, patient sleeps well, good appetite, low energy level, improving attention span.  Rated depression 7, hopeless 6, anxiety 10.  Has experienced cravings and agitation in pat 24 hours.  Denied SI.  Has felt lightheaded, pain in past 24 hours.  Wants to "get better", worst pain #7.  [facility]  No discharge plans.  No problems taking meds after discharge. A:  Medications administered per MD orders.  Emotional support and encouragement given patient. R:  Denied SI and HI.  Denied A/V hallucinations.  Will continue to monitor patient for safety with 15 minute checks.  Safety maintained. Patient's UA ready for lab pickup.

## 2013-10-31 NOTE — Clinical Social Work Note (Signed)
CSW met with pt with MD. Pt labile and quickly escalating when talking about his medications. "the medications you put me on last time are making me hear voices. I suffer from anxiety and depression, not schizophrenia." Pt reports alcohol and marijuana use in attempt to control mood lability and AH. Pt asking for long term treatment. He went to [facility] for 22 days after last admission and went to halfway house after this. Pt hoping for admission to [facility]. CSW assessing for appropriate referrals. Pt interested in [place] but is worried about being put to work, $150 admission fee. Pt does not want to work due to foot injury. CSW let pt know there are limited resources/facilities for people with no money and no insurance that do not want to work. [facility] may be plan B. CSW assessing.  [place], [place] 10/31/2013 9:43 AM
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
## 2013-10-31 NOTE — Progress Notes (Signed)
The focus of this group is to educate the patient on the purpose and policies of crisis stabilization and provide a format to answer questions about their admission.  The group details unit policies and expectations of patients while admitted.  Patient did not attend 0900 nurse education orientation group this morning.  Patient was talking to  MD. 
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
## 2013-10-31 NOTE — [facility] Suicide Risk Assessment (Signed)
Suicide Risk Assessment  Admission Assessment     Nursing information obtained from:  Patient Demographic factors:  Male;Low socioeconomic status;Living alone;Unemployed Current Mental Status:  NA Loss Factors:  Financial problems / change in socioeconomic status Historical Factors:  Prior suicide attempts;Family history of mental illness or substance abuse Risk Reduction Factors:  Responsible for children under 38 years of age Total Time spent with patient: 45 minutes  CLINICAL FACTORS:   Depression:   Comorbid alcohol abuse/dependence Alcohol/Substance Abuse/Dependencies   COGNITIVE FEATURES THAT CONTRIBUTE TO RISK:  Closed-mindedness Polarized thinking Thought constriction (tunnel vision)    SUICIDE RISK:   Moderate:  Frequent suicidal ideation with limited intensity, and duration, some specificity in terms of plans, no associated intent, good self-control, limited dysphoria/symptomatology, some risk factors present, and identifiable protective factors, including available and accessible social support.  PLAN OF CARE: Supportive approach/coping skills/relapse prevention                               Reassess and address the co morbidities  I certify that inpatient services furnished can reasonably be expected to improve the patient's condition.  Danh Bayus A 10/31/2013, 7:11 PM

## 2013-10-31 NOTE — Progress Notes (Signed)
Recreation Therapy Notes  Date: 02.26.2015 Time: 2:45pm Location: [address] Dayroom   Group Topic: Leisure Education  Goal Area(s) Addresses:  Patient will identify positive leisure activities.  Patient will identify positive emotions associated with leisure participation.  Patient will identify one positive benefit of participation in leisure activities.   Behavioral Response: Engaged, Attentive, Appropriate   Intervention: Game  Activity: LRT selected a letter from a bag, using the selected letter patients were asked to identify as many leisure activities as possible that start with the selected letter in 3 minutes. Patients worked in teams of 4. Last round of game was used to ask patients to identify positive emotions associated with leisure participation.   Education:  Leisure Education, Building control surveyorDischarge Planning, Coping Skills   Education Outcome: Acknowledges understanding  Clinical Observations/Feedback: Patient actively engaged in group activity, working well with teammates and identifying words to contribute to team list. Patient contributed to group discussion, relating leisure participation to feel exuberant. Patient agreed with peer who stated that this feeling could carry over into other areas of his life.   Marykay Lexenise L Caitriona Sundquist, LRT/CTRS  Jearl KlinefelterBlanchfield, Ja Ohman L 10/31/2013 3:57 PM

## 2013-10-31 NOTE — Progress Notes (Signed)
Did attend group 

## 2013-11-01 DIAGNOSIS — Z5900 Homelessness unspecified: Secondary | ICD-10-CM

## 2013-11-01 DIAGNOSIS — F1010 Alcohol abuse, uncomplicated: Secondary | ICD-10-CM

## 2013-11-01 LAB — GC/CHLAMYDIA PROBE AMP
CT Probe RNA: NEGATIVE
GC PROBE AMP APTIMA: NEGATIVE

## 2013-11-01 LAB — HIV ANTIBODY (ROUTINE TESTING W REFLEX): HIV: NONREACTIVE

## 2013-11-01 LAB — RPR: RPR: NONREACTIVE

## 2013-11-01 NOTE — [facility] Group Notes (Signed)
[facility] LCSW Group Therapy  11/01/2013 3:01 PM  Type of Therapy:  Group Therapy  Participation Level:  None  Participation Quality:  Drowsy and Inattentive  Affect:  Flat, Irritable and Lethargic  Cognitive:  Lacking  Insight:  None  Engagement in Therapy:  None  Modes of Intervention:  Confrontation, Discussion, Education, Exploration, Problem-solving, Rapport Building, Socialization and Support  Summary of Progress/Problems: Feelings around Relapse. Group members discussed the meaning of relapse and shared personal stories of relapse, how it affected them and others, and how they perceived themselves during this time. Group members were encouraged to identify triggers, warning signs and coping skills used when facing the possibility of relapse. Social supports were discussed and explored in detail. Post Acute Withdrawal Syndrome (handout provided) was introduced and examined. Pt's were encouraged to ask questions, talk about key points associated with PAWS, and process this information in terms of relapse prevention. Fernando [name] fell asleep and remained sleeping for duration of group. At this time, he shows no progress in the group setting. CSW informed pt that he did not get into [facility] due to past behavioral issues. Pt gave CSW autobiography that he was asked to write [facility].    [name], Fernando Boardley LCSWA  11/01/2013, 3:01 PM

## 2013-11-01 NOTE — Progress Notes (Signed)
Patient ID: Fernando GibbonsLawrence Leidy, male   DOB: 04/29/1976, 38 y.o.   MRN: [id number]  D: Patient has an angry affect when coming to medication window. Did not respond when initially speaking to him. Appears angry this am. When trying to speak to him about his mood. He said that he is no different with angry tone and stopped speaking. Undersigned did not engage him any further due to agitation. Medications given this am along with ibuprofen for back pain. Did report on and off SI and depression and hopelessness "4" on scales. Did fill out self inventory sheet.  A: Staff will monitor on q 15 minute checks, follow treatment plan, and give meds as ordered. R: Took meds.

## 2013-11-01 NOTE — Progress Notes (Signed)
[facility] MD Progress Note  11/01/2013 3:28 PM Fernando [name]  MRN:  [id number]  Subjective:  Fernando [name] reports that he is feeling really down today because he lost his nephew yesterday. Says he is having such a hard time because his nephew did not have a chance to even live. He was a new born. He says he has applied to [place], hoping to get in. Says his heart hurts, but denies any SIHI.  Diagnosis:   DSM5: Schizophrenia Disorders:  NA Obsessive-Compulsive Disorders:  NA Trauma-Stressor Disorders:  NA Substance/Addictive Disorders:  Alcohol Related Disorder - Severe (303.90), Cocaine dependence Depressive Disorders:  MDD (major depressive disorder), recurrent episode, severe, Generalized anxiety  Total Time spent with patient: 35 minutes  Axis I: Alcohol Related Disorder - Severe (303.90), Cocaine dependence, MDD, recurrent episodes, severe, GAD Axis II: Deferred Axis III:  Past Medical History  Diagnosis Date  . Fx ankle   . Collar bone fracture   . Appendicitis   . Hepatitis C    Axis IV: Polysubstance dependence Axis V: 41-50 serious symptoms  ADL's:  Fair  Sleep: Fair  Appetite:  Fair  Suicidal Ideation:  Plan:  Denies Intent:  Denies Means:  Denies  Homicidal Ideation:  Plan:  Denies Intent:  Denies Means:  Denies  AEB (as evidenced by): Per patient's reports  Psychiatric Specialty Exam: Physical Exam  Review of Systems  Constitutional: Positive for malaise/fatigue.  HENT: Negative.   Eyes: Negative.   Respiratory: Negative.   Cardiovascular: Negative.   Gastrointestinal: Negative.   Genitourinary: Negative.   Musculoskeletal: Negative.   Skin: Negative.   Neurological: Positive for tremors and weakness.  Psychiatric/Behavioral: Positive for depression (Currently on medication for stabilization) and substance abuse (Alcoholism). Negative for suicidal ideas, hallucinations and memory loss. The patient is nervous/anxious (currently on medication for  stabilization) and has insomnia.     Blood pressure 126/73, pulse 88, temperature 98.7 F (37.1 C), temperature source Oral, resp. rate 20, height 5\' 7"  (1.702 m), weight 87.091 kg (192 lb).Body mass index is 30.06 kg/(m^2).  General Appearance: Casual  Eye Contact::  Fair  Speech:  Clear and Coherent  Volume:  Normal  Mood:  Depressed  Affect:  Flat  Thought Process:  Coherent  Orientation:  Full (Time, Place, and Person)  Thought Content:  Rumination  Suicidal Thoughts:  No  Homicidal Thoughts:  No  Memory:  Immediate;   Good Recent;   Good Remote;   Good  Judgement:  Fair  Insight:  Fair  Psychomotor Activity:  Restlessness and Tremor  Concentration:  Fair  Recall:  Good  Fund of Knowledge:Fair  Language: Good  Akathisia:  No  Handed:  Right  AIMS (if indicated):     Assets:  Desire for Improvement  Sleep:  Number of Hours: 6.5   Musculoskeletal: Strength & Muscle Tone: within normal limits Gait & Station: normal Patient leans: N/A  Current Medications: Current Facility-Administered Medications  Medication Dose Route Frequency Provider Last Rate Last Dose  . acetaminophen (TYLENOL) tablet 650 mg  650 mg Oral Q4H PRN Shuvon Rankin, NP      . alum & mag hydroxide-simeth (MAALOX/MYLANTA) 200-200-20 MG/5ML suspension 30 mL  30 mL Oral PRN Shuvon Rankin, NP      . busPIRone (BUSPAR) tablet 15 mg  15 mg Oral BID Rachael Fee, MD   15 mg at 11/01/13 0830  . chlordiazePOXIDE (LIBRIUM) capsule 25 mg  25 mg Oral Q6H PRN Shuvon Rankin, NP      .  chlordiazePOXIDE (LIBRIUM) capsule 25 mg  25 mg Oral TID Shuvon Rankin, NP   25 mg at 11/01/13 1305   Followed by  . [START ON 11/02/2013] chlordiazePOXIDE (LIBRIUM) capsule 25 mg  25 mg Oral BH-qamhs Shuvon Rankin, NP       Followed by  . [START ON 11/03/2013] chlordiazePOXIDE (LIBRIUM) capsule 25 mg  25 mg Oral Daily Shuvon Rankin, NP      . citalopram (CELEXA) tablet 10 mg  10 mg Oral Daily Rachael FeeIrving A Lugo, MD   10 mg at 11/01/13 0830   . hydrOXYzine (ATARAX/VISTARIL) tablet 25 mg  25 mg Oral Q6H PRN Shuvon Rankin, NP   25 mg at 10/31/13 2136  . ibuprofen (ADVIL,MOTRIN) tablet 600 mg  600 mg Oral Q8H PRN Shuvon Rankin, NP   600 mg at 11/01/13 0832  . loperamide (IMODIUM) capsule 2-4 mg  2-4 mg Oral PRN Shuvon Rankin, NP      . magnesium hydroxide (MILK OF MAGNESIA) suspension 30 mL  30 mL Oral Daily PRN Shuvon Rankin, NP      . multivitamin with minerals tablet 1 tablet  1 tablet Oral Daily Shuvon Rankin, NP   1 tablet at 11/01/13 0830  . nicotine (NICODERM CQ - dosed in mg/24 hours) patch 21 mg  21 mg Transdermal Daily Shuvon Rankin, NP   21 mg at 11/01/13 0830  . OLANZapine zydis (ZYPREXA) disintegrating tablet 5 mg  5 mg Oral TID PRN Rachael FeeIrving A Lugo, MD   5 mg at 11/01/13 1452  . ondansetron (ZOFRAN) tablet 4 mg  4 mg Oral Q8H PRN Shuvon Rankin, NP      . ondansetron (ZOFRAN-ODT) disintegrating tablet 4 mg  4 mg Oral Q6H PRN Shuvon Rankin, NP      . thiamine (VITAMIN B-1) tablet 100 mg  100 mg Oral Daily Shuvon Rankin, NP   100 mg at 11/01/13 0830    Lab Results:  Results for orders placed during the hospital encounter of 10/30/13 (from the past 48 hour(s))  GC/CHLAMYDIA PROBE AMP     Status: None   Collection Time    10/31/13 11:04 AM      Result Value Ref Range   CT Probe RNA NEGATIVE  NEGATIVE   GC Probe RNA NEGATIVE  NEGATIVE   Comment: (NOTE)                                                                                               **Normal Reference Range: Negative**          Assay performed using the Gen-Probe APTIMA COMBO2 (R) Assay.     Acceptable specimen types for this assay include APTIMA Swabs (Unisex,     endocervical, urethral, or vaginal), first void urine, and ThinPrep     liquid based cytology samples.     Performed at [facility]  HIV ANTIBODY (ROUTINE TESTING)     Status: None   Collection Time    10/31/13  7:52 PM      Result Value Ref Range   HIV NON REACTIVE  NON REACTIVE    Comment: Performed at  [facility]  RPR     Status: None   Collection Time    10/31/13  7:52 PM      Result Value Ref Range   RPR NON REACTIVE  NON REACTIVE   Comment: Performed at [facility]    Physical Findings: AIMS: Facial and Oral Movements Muscles of Facial Expression: None, normal Lips and Perioral Area: None, normal Jaw: None, normal Tongue: None, normal,Extremity Movements Upper (arms, wrists, hands, fingers): None, normal Lower (legs, knees, ankles, toes): None, normal, Trunk Movements Neck, shoulders, hips: None, normal, Overall Severity Severity of abnormal movements (highest score from questions above): None, normal Incapacitation due to abnormal movements: None, normal Patient's awareness of abnormal movements (rate only patient's report): No Awareness, Dental Status Current problems with teeth and/or dentures?: No Does patient usually wear dentures?: No  CIWA:  CIWA-Ar Total: 5 COWS:  COWS Total Score: 1  Treatment Plan Summary: Daily contact with patient to assess and evaluate symptoms and progress in treatment Medication management  Plan: Review of chart, vital signs, medications, and notes. 1-Individual and group therapy 2-Medication management for depression and anxiety:  Medications reviewed with the patient, denies any adverse effects. 3-Coping skills for depression, anxiety, and substance dependency 4-Continue crisis stabilization and management 5-Address health issues--monitoring vital signs, stable 6-Treatment plan in progress to prevent relapse of depression, substance abuse, and anxiety  Medical Decision Making Problem Points:  Review of last therapy session (1) and Review of psycho-social stressors (1) Data Points:  Review of medication regiment & side effects (2) Review of new medications or change in dosage (2)  I certify that inpatient services furnished can reasonably be expected to improve the patient's condition.   Armandina Stammer I, PMHNP-BC 11/01/2013, 3:28 PM

## 2013-11-01 NOTE — Progress Notes (Signed)
Recreation Therapy Notes   Date: 02.27.2015 Time: 2:45pm Location: [address] Dayroom    Group Topic: Communication, Team Building, Problem Solving  Goal Area(s) Addresses:  Patient will effectively work with peer towards shared goal.  Patient will identify skill used to make activity successful.  Patient will identify how skills used during activity can be used to build healthy support system.   Behavioral Response: Engaged, Attentive  Intervention: Problem Solving Activity  Activity: [place]. Patients were provided the following materials: 5 drinking straws, 5 rubber bands, 5 paper clips, 2 index cards, 2 drinking cups, and 2 toilet paper rolls. Using the provided materials patients were asked to build a launching mechanisms to launch a ping pong ball approximately 12 feet. Patients were divided into teams of 3-5.   Education: Pharmacist, communityocial Skills, Building control surveyorDischarge Planning.   Education Outcome: Acknowledges understanding   Clinical Observations/Feedback: Prior to group session starting patient was at nurses station, LRT asked patient if he was going to attend recreation therapy group session. Patient became immediately enraged, yelling and stating "you don't play with me" patient walked away from LRT continuing to yell. Patient arrived to group session approximately 10 minutes after it began, patient presented with agitated affect and made very few statements. Patient joined team, but initially made no suggestions. As group progressed patient was observed to brighten and interact with team mates and other group members. By the conclusion of group session patient affect euthymic. At conclusion of group session patient apologized to LRT for yelling at her and stated "see this is why I'm here, because I blank on people."   Upon arrival to group session patient was slow to interact in group activity, patient eventually engaged and was appropriate with team mates. Patient made no contributions to group  discussion, but appeared to actively listen as he maintained appropriate eye contact with speaker.   Marykay Lexenise L Tazia Illescas, LRT/CTRS  Jeris Easterly L 11/01/2013 4:01 PM

## 2013-11-01 NOTE — Progress Notes (Signed)
D: Paitent in the bed in approach.  Patient states he is tired from the medications he was given earlier today.  Patient states he had a bad day.  It was reported by staff that patient fell in the gym today while playing basketball.  Patient minimizing and states, "Oh I didn't fall, I will be all right.  Patient appear flat and depressed and irritable.  Patient states he has passive SI but verbally contracts for safety.  Patient denies HI and states he is hearing voices but will not elaborate. Patient fall risk bundle to continue tonight VS Q 4 hours. A: Staff to monitor Q 15 mins for safety.  Encouragement and support offered.  No scheduled medications administered per orders.   R: Patient remains safe on the unit.  Patient did not attend group tonight.  Patient had no medications administered on this shift.  Patient not visible on the unit tonight.

## 2013-11-01 NOTE — [facility] Suicide Risk Assessment (Signed)
[facility] INPATIENT:  Family/Significant Other Suicide Prevention Education  Suicide Prevention Education:  Patient Refusal for Family/Significant Other Suicide Prevention Education: The patient Fernando GibbonsLawrence Holloran has refused to provide written consent for family/significant other to be provided Family/Significant Other Suicide Prevention Education during admission and/or prior to discharge.  Physician notified.  Pt decided against consenting to family contact. SPE completed with pt. SPI pamphlet provided to pt and he was encouraged to share information with support network, ask questions, and talk about any concerns relating to SPE.  Smart, Ventura Leggitt LCSWA  11/01/2013, 8:30 AM

## 2013-11-01 NOTE — Tx Team (Signed)
Interdisciplinary Treatment Plan Update (Adult)  Date: 11/01/2013   Time Reviewed: 10:57 AM  Progress in Treatment:  Attending groups: Yes  Participating in groups:  Yes  Taking medication as prescribed: Yes  Tolerating medication: Yes  Family/Significant othe contact made: No. Pt did not consent to family contact. SPE completed with pt.   Patient understands diagnosis: Yes, AEB seeking treatmet for AVH, mood stabilization, ETOH detox, SI, and medication management.  Discussing patient identified problems/goals with staff: Yes  Medical problems stabilized or resolved: Yes  Denies suicidal/homicidal ideation: Yes during group/self report.   Patient has not harmed self or Others: Yes  New problem(s) identified: Pt is positive for AVH this morning.  Discharge Plan or Barriers: [facility] but due to issue the last time he was there, Trey PaulaJeff must review pt case with administration and will let CSW know decision today. Pt also in process of completing [facility] application. He is interested in [facility] as last resort.  Additional comments:37 Y/O male with history of alcohol dependence who states he has had a rough time. States he is drinking as much as his money can buy. Liquor, 40's. Has used some cocaine. Has had multiple suicides in family. Last year his 38 Y/O cousin killed himself. Admits to thoughs of suicide himself, he laid on the road waiting for a car to kill him. He is currently homeless. He has financial problems as he has not been able to work since he hurt his ankle. States he is very upset as he was given Risperdal and he goggled it and it is for schizophrenia state since the Risperdal he has heard voices but then states he started hearing voices aferr he fell from a stand.   Reason for Continuation of Hospitalization: Librium taper-withdrawals Medication management Mood stabilization AVH Estimated length of stay: 3-5 days  For review of initial/current  patient goals, please see plan of care.  Attendees:  Patient:    Family:    Physician:    Nursing: Ardine EngJennifer RN 11/01/2013 10:57 AM   Clinical Social Worker Arsen Mangione Smart, LCSWA  11/01/2013 10:57 AM   Other: Alexia FreestonePatty RN 11/01/2013 10:57 AM   Other: Chandra BatchAggie N. PA 11/01/2013 10:57 AM   Other:    Other:    Scribe for Treatment Team:  Trula SladeHeather Smart LCSWA 11/01/2013 10:57 AM

## 2013-11-01 NOTE — [facility] Group Notes (Signed)
[facility]   11/01/2013 9:32 AM  Participation Quality:  Minimal   Mood/Affect:  Irritable and Labile  Depression Rating:  6  Anxiety Rating:  10  Thoughts of Suicide:  No Will you contract for safety?   NA  Current AVH:  No  Plan for Discharge/Comments:  Pt informed that [facility]'s [facility] before allowing him to return to facility due to "serious [facility] with [facility] and is in process of writing his autobiography-required for admission process. Plan C: [facility]-application will be provided to pt to complete today per his request.   Transportation Means: unknown at this time/bus   Supports: none identified by pt.   Smart, American FinancialHeather LCSWA

## 2013-11-01 NOTE — Progress Notes (Signed)
Patient ID: Fernando GibbonsLawrence Soucy, male   DOB: 11/10/1975, 38 y.o.   MRN: [id number]  D: Technicians called to unit and stated that patient had collided with another peer while playing basketball and fell back on buttocks and back. Undersigned went down there to assess injury and obtain vital signs. Patient continued to play basketball after he fell. Patient stopped playing briefly to let us look at his back, head ect for injury. No injury seen at present. Patient reports his rt side is hurting some but nothing else. Already has c/o some back pain at times. Patient not wanting us to take vital signs at present and wanted to stay in gym. Started fall documentation. A: Talked to patient about playing less hard and to rest due to potential to be sore. R: Patient refused tape on shoes and fall risk band

## 2013-11-01 NOTE — Progress Notes (Signed)
Patient ID: Fernando GibbonsLawrence [name], male   DOB: 10/22/1975, 38 y.o.   MRN: [id number]  D: Patient became agitated this afternoon. Hit the door and started yelling about people better stop joking with him. Unknown exactly who or what he is talking about but c/o staff "in green" getting smart with him when he was trying to ask about talking to Child psychotherapistsocial worker. Patient redirected by nurse to private room and allowed him to ventilate. Given Zyprexa 5mg  at this time. Patient reports he will calm down but still threatening to pull staff over counter if they "keep messing with me". A: Staff will monitor on q 15 minute checks and redirect as needed. R: Took prn and ventilated then went to group.

## 2013-11-01 NOTE — Clinical Social Work Note (Addendum)
CSW gave pt [facility] application and told him to give to [place] (weekend CSW). CSW to fax either this weekend or on Monday morning to [facility]. Pt provided CSW with 2 page auto biography. Also, pt denied admission to both [facility] and [facility] (behavioral issues [facility] per Trey PaulaJeff A.).  [employer] Sher[employer]-WilliamsHeather Smart, LCSWA 11/01/2013 3:17 PM

## 2013-11-02 NOTE — [facility] Group Notes (Signed)
[facility] Group Notes:  (Nursing/MHT/Case Management/Adjunct)  Date:  11/02/2013  Time:  4:56 PM  Type of Therapy:  Psychoeducational Skills  Participation Level:  Active  Participation Quality:  Appropriate, Drowsy and Sharing  Affect:  Depressed  Cognitive:  Oriented  Insight:  Limited  Engagement in Group:  Engaged and Supportive  Modes of Intervention:  Discussion, Education, Problem-solving and Support  Summary of Progress/Problems:  Watched video on addiction and its ability to destroy the lives of those effected by it.  Group discussion after video on addiction to include support systems and other resources.  Inda MerlinWilliams, Yanci Bachtell R 11/02/2013, 4:56 PM

## 2013-11-02 NOTE — Progress Notes (Signed)
Patient ID: Fernando [name], male   DOB: 10/18/1975, 38 y.o.   MRN[facility] [id number] [facility]  11/02/2013 9:20 AM Fernando [name]  MRN:  [id number]  Subjective:  Fernando [name] reports that he is feeling really agitated today because he lost his nephew. Says he is having such a hard time because his nephew did not have a chance to even live, and his twin sister was looking forward to being a grandma. He was a still born, and he wishes he could be there for his family. He says he has applied to [employer] and submitted a two page essay, hoping to get in. He is requesting to speak with the social worker in regards to this.He currently rates his anxiety at 7/10, and depression 10/10. Denies any SIHI/AVH. He reports having nightmares every night, but states he went to bed at 6pm and just now waking up". He states he wasn't sleeping the whole time, he just wanted to isolate himself so he could mourn and wouldn't be ridicule. He states today is a new day, and I will make sure I am happy all day.   Diagnosis:   DSM5: Schizophrenia Disorders:  NA Obsessive-Compulsive Disorders:  NA Trauma-Stressor Disorders:  NA Substance/Addictive Disorders:  Alcohol Related Disorder - Severe (303.90), Cocaine dependence Depressive Disorders:  MDD (major depressive disorder), recurrent episode, severe, Generalized anxiety  Total Time spent with patient: 35 minutes  Axis I: Alcohol Related Disorder - Severe (303.90), Cocaine dependence, MDD, recurrent episodes, severe, GAD Axis II: Deferred Axis III:  Past Medical History  Diagnosis Date  . Fx ankle   . Collar bone fracture   . Appendicitis   . Hepatitis C    Axis IV: Polysubstance dependence Axis V: 41-50 serious symptoms  ADL's:  Fair  Sleep: Fair  Appetite:  Fair  Suicidal Ideation:  Plan:  Denies Intent:  Denies Means:  Denies  Homicidal Ideation:  Plan:  Denies Intent:  Denies Means:  Denies  AEB (as evidenced by): Per patient's  reports  Psychiatric Specialty Exam: Physical Exam  Review of Systems  Constitutional: Positive for malaise/fatigue.  HENT: Negative.   Eyes: Negative.   Respiratory: Negative.   Cardiovascular: Negative.   Gastrointestinal: Negative.   Genitourinary: Negative.   Musculoskeletal: Negative.   Skin: Negative.   Neurological: Positive for tremors and weakness.  Psychiatric/Behavioral: Positive for depression (Currently on medication for stabilization) and substance abuse (Alcoholism). Negative for suicidal ideas, hallucinations and memory loss. The patient is nervous/anxious (currently on medication for stabilization) and has insomnia.     Blood pressure 112/68, pulse 66, temperature 97.8 F (36.6 C), temperature source Oral, resp. rate 16, height 5\' 7"  (1.702 m), weight 87.091 kg (192 lb).Body mass index is 30.06 kg/(m^2).  General Appearance: Casual  Eye Contact::  Fair  Speech:  Clear and Coherent  Volume:  Normal  Mood:  Depressed  Affect:  Flat  Thought Process:  Coherent  Orientation:  Full (Time, Place, and Person)  Thought Content:  Rumination  Suicidal Thoughts:  No  Homicidal Thoughts:  No  Memory:  Immediate;   Good Recent;   Good Remote;   Good  Judgement:  Fair  Insight:  Fair  Psychomotor Activity:  Restlessness, Tremor and agitation  Concentration:  Fair  Recall:  Good  Fund of Knowledge:Fair  Language: Good  Akathisia:  No  Handed:  Right  AIMS (if indicated):     Assets:  Desire for Improvement  Sleep:  Number of Hours: 6.5   Musculoskeletal:  Strength & Muscle Tone: within normal limits Gait & Station: normal Patient leans: N/A  Current Medications: Current Facility-Administered Medications  Medication Dose Route Frequency Provider Last Rate Last Dose  . acetaminophen (TYLENOL) tablet 650 mg  650 mg Oral Q4H PRN Shuvon Rankin, NP      . alum & mag hydroxide-simeth (MAALOX/MYLANTA) 200-200-20 MG/5ML suspension 30 mL  30 mL Oral PRN Shuvon Rankin, NP       . busPIRone (BUSPAR) tablet 15 mg  15 mg Oral BID Rachael Fee, MD   15 mg at 11/02/13 0901  . chlordiazePOXIDE (LIBRIUM) capsule 25 mg  25 mg Oral Q6H PRN Shuvon Rankin, NP      . chlordiazePOXIDE (LIBRIUM) capsule 25 mg  25 mg Oral BH-qamhs Shuvon Rankin, NP   25 mg at 11/02/13 0902   Followed by  . [START ON 11/03/2013] chlordiazePOXIDE (LIBRIUM) capsule 25 mg  25 mg Oral Daily Shuvon Rankin, NP      . citalopram (CELEXA) tablet 10 mg  10 mg Oral Daily Rachael Fee, MD   10 mg at 11/02/13 0902  . hydrOXYzine (ATARAX/VISTARIL) tablet 25 mg  25 mg Oral Q6H PRN Shuvon Rankin, NP   25 mg at 10/31/13 2136  . ibuprofen (ADVIL,MOTRIN) tablet 600 mg  600 mg Oral Q8H PRN Shuvon Rankin, NP   600 mg at 11/02/13 0636  . loperamide (IMODIUM) capsule 2-4 mg  2-4 mg Oral PRN Shuvon Rankin, NP      . magnesium hydroxide (MILK OF MAGNESIA) suspension 30 mL  30 mL Oral Daily PRN Shuvon Rankin, NP      . multivitamin with minerals tablet 1 tablet  1 tablet Oral Daily Shuvon Rankin, NP   1 tablet at 11/02/13 0905  . nicotine (NICODERM CQ - dosed in mg/24 hours) patch 21 mg  21 mg Transdermal Daily Shuvon Rankin, NP   21 mg at 11/02/13 0639  . OLANZapine zydis (ZYPREXA) disintegrating tablet 5 mg  5 mg Oral TID PRN Rachael Fee, MD   5 mg at 11/01/13 1452  . ondansetron (ZOFRAN) tablet 4 mg  4 mg Oral Q8H PRN Shuvon Rankin, NP      . ondansetron (ZOFRAN-ODT) disintegrating tablet 4 mg  4 mg Oral Q6H PRN Shuvon Rankin, NP      . thiamine (VITAMIN B-1) tablet 100 mg  100 mg Oral Daily Shuvon Rankin, NP   100 mg at 11/02/13 1610    Lab Results:  Results for orders placed during the hospital encounter of 10/30/13 (from the past 48 hour(s))  GC/CHLAMYDIA PROBE AMP     Status: None   Collection Time    10/31/13 11:04 AM      Result Value Ref Range   CT Probe RNA NEGATIVE  NEGATIVE   GC Probe RNA NEGATIVE  NEGATIVE   Comment: (NOTE)                                                                                                **Normal Reference Range: Negative**          Assay performed using the  Gen-Probe APTIMA COMBO2 (R) Assay.     Acceptable specimen types for this assay include APTIMA Swabs (Unisex,     endocervical, urethral, or vaginal), first void urine, and ThinPrep     liquid based cytology samples.     Performed at [facility]  HIV ANTIBODY (ROUTINE TESTING)     Status: None   Collection Time    10/31/13  7:52 PM      Result Value Ref Range   HIV NON REACTIVE  NON REACTIVE   Comment: Performed at [facility]  RPR     Status: None   Collection Time    10/31/13  7:52 PM      Result Value Ref Range   RPR NON REACTIVE  NON REACTIVE   Comment: Performed at [facility]    Physical Findings: AIMS: Facial and Oral Movements Muscles of Facial Expression: None, normal Lips and Perioral Area: None, normal Jaw: None, normal Tongue: None, normal,Extremity Movements Upper (arms, wrists, hands, fingers): None, normal Lower (legs, knees, ankles, toes): None, normal, Trunk Movements Neck, shoulders, hips: None, normal, Overall Severity Severity of abnormal movements (highest score from questions above): None, normal Incapacitation due to abnormal movements: None, normal Patient's awareness of abnormal movements (rate only patient's report): No Awareness, Dental Status Current problems with teeth and/or dentures?: No Does patient usually wear dentures?: No  CIWA:  CIWA-Ar Total: 2 COWS:  COWS Total Score: 1  Treatment Plan Summary: Daily contact with patient to assess and evaluate symptoms and progress in treatment Medication management  Plan: Review of chart, vital signs, medications, and notes. 1-Individual and group therapy 2-Medication management for depression and anxiety:  Medications reviewed with the patient, denies any adverse effects. 3-Coping skills for depression, anxiety, and substance dependency 4-Continue crisis stabilization and  management 5-Address health issues--monitoring vital signs, stable 6-Treatment plan in progress to prevent relapse of depression, substance abuse, and anxiety  Medical Decision Making Problem Points:  Review of last therapy session (1) and Review of psycho-social stressors (1) Data Points:  Review of medication regiment & side effects (2) Review of new medications or change in dosage (2)  I certify that inpatient services furnished can reasonably be expected to improve the patient's condition.   Truman Hayward, PMHNP-BC 11/02/2013, 9:20 AM

## 2013-11-02 NOTE — Progress Notes (Signed)
11-02-13  NSG NOTE  7a-3p  D: Affect is depressed.  Mood is depressed.  Behavior is cooperative with encouragement, direction and support, requires occasional redirection.  Interacts appropriately with peers and staff with direction.  Participated in RN group, counselor lead group.  Pt continues to complain of back pain, but does not request meds.  Pt stated pain is positional and that he can "shake it off and stretch it out".   Reports poor sleep and improving appetite.  States energy level is low and poor ability to to maintain attention.   A:  Medications per MD order.  Support given throughout day.  1:1 time spent with pt.  R:  Following treatment plan.  Passive SI. Denies HI, auditory or visual hallucinations.  Contracts for safety.

## 2013-11-02 NOTE — Progress Notes (Signed)
Pt attended AA group this evening.  

## 2013-11-02 NOTE — Progress Notes (Signed)
Pt attended the evening AA group in the dayroom. 

## 2013-11-02 NOTE — Progress Notes (Signed)
Pt up in dayroom requesting something for pain stating, "It hurts too much to move. You all might need to xray this. I wonder if it's my rib. It was better, almost gone but now it's a 10/10." Pt also requesting something for anxiety and sleep. Brought tylenol, zydis and scheduled librium to pt in dayroom. Offered emotional support. Pt is denying withdrawal symptoms stating, "I think the librium you've been giving me has prevented them." While patient endorsed passive SI earlier today, he denies at present. Contracts for safety should that change. No HI/AVH. On reassess, pt is sleeping soundly in bed. No behavioral outbursts observed this evening. Pt safe. Emrys MarseillesFriedman, Lakeidra Reliford Eakes

## 2013-11-02 NOTE — [facility] Group Notes (Signed)
[facility] Group Notes:  (Clinical Social Work)  11/02/2013     10-11AM  Summary of Progress/Problems:   The main focus of today's process group was for the patient to identify ways in which they have in the past sabotaged their own recovery. Motivational Interviewing and a worksheet were utilized to help patients explore the perceived benefits and costs of their substance use, as well as the potential benefits and costs of stopping.  The Stages of Change were explained and patients were encouraged to make a written plan based on what was shared in group today. The patient expressed that if the use of substances were stopped, he would expect to feel happy. The benefit of his substance abuse is that he loves the way it makes him feel, that it helps him to not be so depressed.  He was unable to identify the cost to him of his substance abuse, as he was more focused on complaining about the doctors not listening to him and using him as a [place] for medication.  He stated the benefit of him stopping using, however, would be that he would acknowledge his problems and not become "complacent".  He also would be able to see his son, but he minimized the importance of this due to its small probability of happening.  Type of Therapy:  Group Therapy - Process   Participation Level:  Active  Participation Quality:  Intrusive and Monopolizing  Affect:  Flat and Irritable  Cognitive:  Alert  Insight:  Limited  Engagement in Therapy:  Improving  Modes of Intervention:  Education, Support and Processing, Motivational Interviewing  Ambrose MantleMareida Grossman-Orr, LCSW 11/02/2013, 12:34 PM

## 2013-11-03 ENCOUNTER — Inpatient Hospital Stay (HOSPITAL_COMMUNITY): Payer: Federal, State, Local not specified - Other

## 2013-11-03 DIAGNOSIS — T1490XA Injury, unspecified, initial encounter: Secondary | ICD-10-CM

## 2013-11-03 MED ORDER — METHOCARBAMOL 500 MG PO TABS
ORAL_TABLET | ORAL | Status: AC
  Filled 2013-11-03: qty 2

## 2013-11-03 MED ORDER — TRAMADOL HCL 50 MG PO TABS
ORAL_TABLET | ORAL | Status: AC
  Administered 2013-11-03: 50 mg
  Filled 2013-11-03: qty 1

## 2013-11-03 MED ORDER — TRAMADOL HCL 50 MG PO TABS
50.0000 mg | ORAL_TABLET | ORAL | Status: DC | PRN
  Administered 2013-11-03 – 2013-11-05 (×9): 50 mg via ORAL
  Filled 2013-11-03 (×8): qty 1

## 2013-11-03 MED ORDER — LIDOCAINE 5 % EX PTCH
MEDICATED_PATCH | CUTANEOUS | Status: AC
  Administered 2013-11-03: 1
  Filled 2013-11-03: qty 1

## 2013-11-03 MED ORDER — METHOCARBAMOL 500 MG PO TABS
750.0000 mg | ORAL_TABLET | Freq: Four times a day (QID) | ORAL | Status: DC | PRN
  Administered 2013-11-03 – 2013-11-05 (×8): 750 mg via ORAL
  Filled 2013-11-03 (×8): qty 2

## 2013-11-03 MED ORDER — LIDOCAINE 5 % EX PTCH
1.0000 | MEDICATED_PATCH | CUTANEOUS | Status: DC
  Administered 2013-11-03 – 2013-11-12 (×10): 1 via TRANSDERMAL
  Filled 2013-11-03 (×6): qty 1
  Filled 2013-11-03: qty 60
  Filled 2013-11-03 (×6): qty 1

## 2013-11-03 NOTE — Progress Notes (Signed)
Adult Psychoeducational Group Note  Date:  11/03/2013 Time:  0900  Group Topic/Focus:  Making Healthy Choices:   The focus of this group is to help patients identify negative/unhealthy choices they were using prior to admission and identify positive/healthier coping strategies to replace them upon discharge.  Participation Level:  Active  Participation Quality:  Appropriate, Attentive, Intrusive, Redirectable, Sharing and Supportive  Affect:  Appropriate and Excited  Cognitive:  Alert and Appropriate  Insight: Improving  Engagement in Group:  Defensive, Engaged and Improving  Modes of Intervention:  Confrontation, Discussion, Education and Exploration  Additional Comments:  Pt is very engaged and seems vested in tx. His has good insight and is also improving. Pt has valuable input during group.  Chayla Shands Shari Prowsvan 11/03/2013, 2:16 PM

## 2013-11-03 NOTE — Progress Notes (Addendum)
Pt up at nurses' station demanding something further for pain. Offered ibuprofen as patient last took tylenol. Pt angry, refusing offer. "Man, you all aren't giving me anything for this. It needs to be checked out and you need to give me something stronger." Explained to pt given his hx and current detox, it would be counterproductive to give him anything stronger but reassured pt this writer would consult with PA on call. Wyn Forster Kober PA called who will review patient's chart and come to unit to assess pt. Rishith MarseillesFriedman, Delicia Berens Eakes  Pt returned to nurses' station and was informed of provider's response. Willing to take ibuprofen now. Given without incident for 10/10. Robbie MarseillesFriedman, Robert Sunga Eakes

## 2013-11-03 NOTE — Progress Notes (Signed)
Patient ID: Fernando GibbonsLawrence Lemen, male   DOB: 06/21/1976, 38 y.o.   MRN: [id number] Client returned from transport to [facility] for xray to his ribs due to complaints of pain and recent fall. He continues to be loud and intrusive, but is redirectable.

## 2013-11-03 NOTE — Progress Notes (Signed)
Adult Psychoeducational Group Note  Date:  11/03/2013 Time:  6:28 PM  Group Topic/Focus:  Coping With Mental Health Crisis:   The purpose of this group is to help patients identify strategies for coping with mental health crisis.  Group discusses possible causes of crisis and ways to manage them effectively.  Participation Level:  Active  Participation Quality:  Appropriate  Affect:  Appropriate  Cognitive:  Appropriate  Insight: Appropriate  Engagement in Group:  Engaged  Modes of Intervention:  Discussion  Additional Comments:  Pt was present in group and played coping skills pictionary. He said that playing basketball with his son is his favorite way to destress.  Gaetana MichaelisMingia, Theon Sobotka A 11/03/2013, 6:28 PM

## 2013-11-03 NOTE — Progress Notes (Signed)
Patient did not attend the evening speaker AA meeting. Pt remained in bed sleeping.

## 2013-11-03 NOTE — [facility] Group Notes (Signed)
[facility] Group Notes:  (Nursing/MHT/Case Management/Adjunct)  Date:  11/03/2013  Time:  1:17 PM  Type of Therapy:  Psychoeducational Skills  Participation Level:  Minimal  Participation Quality:  Drowsy  Affect:  Lethargic  Cognitive:  Appropriate  Insight:  Appropriate  Engagement in Group:  Limited  Modes of Intervention:  Discussion  Summary of Progress/Problems:  Video on how attitude effects addiction, with group discussion on video.  Inda MerlinWilliams, Elya Diloreto R 11/03/2013, 1:17 PM

## 2013-11-03 NOTE — Progress Notes (Signed)
Patient ID: Fernando [name], male   DOB: Aug 18, 1976, 38 y.o.   MRN: [id number] [facility] MD Progress Note  11/03/2013 11:37 AM Fernando [name]  MRN:  [id number]  Subjective:  Fernando [name] reports that he is not feeling good today.  States the medication is working for him, but he is still emotional "the medicine wont let me cry" He says he has applied to [employer], he has interview tomorrow he called and spoke with the representative today.He currently rates his anxiety at 8/10, and depression 10/10. Denies any SI/HI/AVH. Reports sleeping poorly, believes this is due to nicotine patch. He states today is a new day, and I will make sure I am happy all day.   Diagnosis:   DSM5: Schizophrenia Disorders:  NA Obsessive-Compulsive Disorders:  NA Trauma-Stressor Disorders:  NA Substance/Addictive Disorders:  Alcohol Related Disorder - Severe (303.90), Cocaine dependence Depressive Disorders:  MDD (major depressive disorder), recurrent episode, severe, Generalized anxiety  Total Time spent with patient: 35 minutes  Axis I: Alcohol Related Disorder - Severe (303.90), Cocaine dependence, MDD, recurrent episodes, severe, GAD Axis II: Deferred Axis III:  Past Medical History  Diagnosis Date  . Fx ankle   . Collar bone fracture   . Appendicitis   . Hepatitis C    Axis IV: Polysubstance dependence Axis V: 41-50 serious symptoms  ADL's:  Fair  Sleep: Fair  Appetite:  Fair  Suicidal Ideation:  Plan:  Denies Intent:  Denies Means:  Denies  Homicidal Ideation:  Plan:  Denies Intent:  Denies Means:  Denies  AEB (as evidenced by): Per patient's reports  Psychiatric Specialty Exam: Physical Exam   Review of Systems  Constitutional: Positive for malaise/fatigue.  HENT: Negative.   Eyes: Negative.   Respiratory: Negative.   Cardiovascular: Negative.   Gastrointestinal: Negative.   Genitourinary: Negative.   Musculoskeletal: Negative.   Skin: Negative.   Neurological: Positive for tremors and  weakness.  Psychiatric/Behavioral: Positive for depression (Currently on medication for stabilization) and substance abuse (Alcoholism). Negative for suicidal ideas, hallucinations and memory loss. The patient is nervous/anxious (currently on medication for stabilization) and has insomnia.     Blood pressure 118/82, pulse 66, temperature 98 F (36.7 C), temperature source Oral, resp. rate 17, height 5\' 7"  (1.702 m), weight 87.091 kg (192 lb).Body mass index is 30.06 kg/(m^2).  General Appearance: Casual  Eye Contact::  Fair  Speech:  Clear and Coherent  Volume:  Normal  Mood:  Depressed  Affect:  Flat  Thought Process:  Coherent  Orientation:  Full (Time, Place, and Person)  Thought Content:  Rumination  Suicidal Thoughts:  No  Homicidal Thoughts:  No  Memory:  Immediate;   Good Recent;   Good Remote;   Good  Judgement:  Fair  Insight:  Fair  Psychomotor Activity:  Restlessness, Tremor and agitation  Concentration:  Fair  Recall:  Good  Fund of Knowledge:Fair  Language: Good  Akathisia:  No  Handed:  Right  AIMS (if indicated):     Assets:  Desire for Improvement  Sleep:  Number of Hours: 5   Musculoskeletal: Strength & Muscle Tone: within normal limits Gait & Station: normal Patient leans: N/A  Current Medications: Current Facility-Administered Medications  Medication Dose Route Frequency Provider Last Rate Last Dose  . acetaminophen (TYLENOL) tablet 650 mg  650 mg Oral Q4H PRN Shuvon Rankin, NP   650 mg at 11/02/13 2141  . alum & mag hydroxide-simeth (MAALOX/MYLANTA) 200-200-20 MG/5ML suspension 30 mL  30 mL Oral PRN Shuvon Rankin,  NP      . busPIRone (BUSPAR) tablet 15 mg  15 mg Oral BID Rachael FeeIrving A Lugo, MD   15 mg at 11/03/13 [phone]  . citalopram (CELEXA) tablet 10 mg  10 mg Oral Daily Rachael FeeIrving A Lugo, MD   10 mg at 11/03/13 [phone]  . ibuprofen (ADVIL,MOTRIN) tablet 600 mg  600 mg Oral Q8H PRN Shuvon Rankin, NP   600 mg at 11/03/13 0045  . lidocaine (LIDODERM) 5 % 1 patch  1  patch Transdermal Q24H Court Joyharles E Kober, PA-C   1 patch at 11/03/13 [phone]  . magnesium hydroxide (MILK OF MAGNESIA) suspension 30 mL  30 mL Oral Daily PRN Shuvon Rankin, NP      . methocarbamol (ROBAXIN) tablet 750 mg  750 mg Oral QID PRN Court Joyharles E Kober, PA-C   750 mg at 11/03/13 0121  . multivitamin with minerals tablet 1 tablet  1 tablet Oral Daily Shuvon Rankin, NP   1 tablet at 11/03/13 0852  . nicotine (NICODERM CQ - dosed in mg/24 hours) patch 21 mg  21 mg Transdermal Daily Shuvon Rankin, NP   21 mg at 11/03/13 0853  . OLANZapine zydis (ZYPREXA) disintegrating tablet 5 mg  5 mg Oral TID PRN Rachael FeeIrving A Lugo, MD   5 mg at 11/03/13 1010  . ondansetron (ZOFRAN) tablet 4 mg  4 mg Oral Q8H PRN Shuvon Rankin, NP      . thiamine (VITAMIN B-1) tablet 100 mg  100 mg Oral Daily Shuvon Rankin, NP   100 mg at 11/03/13 0852  . traMADol (ULTRAM) tablet 50 mg  50 mg Oral Q4H PRN Court Joyharles E Kober, PA-C   50 mg at 11/03/13 [phone]    Lab Results:  No results found for this or any previous visit (from the past 48 hour(s)).  Physical Findings: AIMS: Facial and Oral Movements Muscles of Facial Expression: None, normal Lips and Perioral Area: None, normal Jaw: None, normal Tongue: None, normal,Extremity Movements Upper (arms, wrists, hands, fingers): None, normal Lower (legs, knees, ankles, toes): None, normal, Trunk Movements Neck, shoulders, hips: None, normal, Overall Severity Severity of abnormal movements (highest score from questions above): None, normal Incapacitation due to abnormal movements: None, normal Patient's awareness of abnormal movements (rate only patient's report): No Awareness, Dental Status Current problems with teeth and/or dentures?: No Does patient usually wear dentures?: No  CIWA:  CIWA-Ar Total: 0 COWS:  COWS Total Score: 1  Treatment Plan Summary: Daily contact with patient to assess and evaluate symptoms and progress in treatment Medication management  Plan: Review of chart,  vital signs, medications, and notes. 1-Individual and group therapy 2-Medication management for depression and anxiety:  Medications reviewed with the patient, denies any adverse effects. 3-Coping skills for depression, anxiety, and substance dependency 4-Continue crisis stabilization and management 5-Address health issues--monitoring vital signs, stable 6-Treatment plan in progress to prevent relapse of depression, substance abuse, and anxiety  Medical Decision Making Problem Points:  Review of last therapy session (1) and Review of psycho-social stressors (1) Data Points:  Review of medication regiment & side effects (2) Review of new medications or change in dosage (2)  I certify that inpatient services furnished can reasonably be expected to improve the patient's condition.   Truman HaywardSTARKES, Lyzbeth Genrich S, PMHNP-BC 11/03/2013, 11:37 AM

## 2013-11-03 NOTE — [facility] Group Notes (Signed)
[facility] Group Notes:  (Nursing/MHT/Case Management/Adjunct)  Date:  11/03/2013  Time:  1:15 PM  Type of Therapy:  Psychoeducational Skills  Participation Level:  Active  Participation Quality:  Attentive and Sharing  Affect:  Appropriate  Cognitive:  Oriented  Insight:  Improving  Engagement in Group:  Engaged and Supportive  Modes of Intervention:  Discussion, Education and Support  Summary of Progress/Problems:Group discussion on addiction and healthy support systems.   Fernando [name], Fernando [name] R 11/03/2013, 1:15 PM

## 2013-11-03 NOTE — Progress Notes (Signed)
Patient ID: Marvetta GibbonsLawrence [name], male   DOB: 12/30/1975, 38 y.o.   MRN: [id number] S-Called by floor nurse regarding pt c/o of severe  Rt post lower rib/back pain not relieved by tylenol and motrin after falling on this area playing basketball Friday..Pt is being treated for ETOH /cocaine and substance induced psychosis.Marland Kitchen.He also has dx of MDD-no opiate dependence.Pt stat he was running after ball when he his feet slid out from under him causing him to land unguarded on his rt back/post thorax/rib area. Pain is 10/10.It has gotten progressively worse.He is unable to lie on that area/sleep/sit comfortably.  O-Is in obvious pain/distress.Walks guarding rt post chest/back.Spine is not tender.? Of faint bruising rt lower posterior rib cage.Light touch to skin produces no pain in this area but touch to rib cage produces exquisite pains.Deep breathing recreates the pain.He is unable to lie down on that area in bed.  A-Contusion of rt posterior rib cage R/O Fx.  P-Tramadol 50  mg  q4h pending Xray     Continue Motrin/Tylenol      Robaxin 750 qid pending xray      Lidocaine patch (es) to affected area pending Xray      Xray affected area (Rt posterior rib cage) may do tomorrow unless pt cannot be settled with medications ordered.

## 2013-11-03 NOTE — Progress Notes (Signed)
11-03-13 NSG NOTE 7a-3p D: Affect is depressed, brightens on approach and with interaction. Mood is depressed. Behavior is cooperative with encouragement, direction and support, requires occasional redirection. Interacts appropriately with peers and staff with direction.  Pt can be loud and irritating at times to peers requiring close observation during increased agitation.   Participated in RN group, counselor lead group. Reports poor sleep and good appetite. States energy level is low and improving ability to to maintain attention. Identified changes that would have to be made post discharge.  Pt is excited about possibility to discharge to [facility] care.   A: Medications per MD order. Support given throughout day. 1:1 time spent with pt.  [facility].  R: Following treatment plan. Denies HI/SI, auditory or visual hallucinations. Contracts for safety.

## 2013-11-03 NOTE — [facility] Group Notes (Addendum)
[facility] Group Notes:  (Clinical Social Work)  11/03/2013  10:00-11:00AM  Summary of Progress/Problems:   The main focus of today's process group was to   identify the patient's current support system and decide on other supports that can be put in place.  The picture on workbook was used to discuss why additional supports are needed.  An emphasis was placed on using counselor, doctor, therapy groups, 12-step groups, and problem-specific support groups to expand supports.   There was also an extensive discussion about what constitutes a healthy support versus an unhealthy support. The patient stated the current supports in place are his mother and son.  He then fell asleep.  Type of Therapy:  Process Group with Motivational Interviewing  Participation Level:  Minimal  Participation Quality:  Drowsy  Affect:  Flat  Cognitive:  Lacking  Insight:  Limited  Engagement in Therapy:  Limited  Modes of Intervention:   Education, Support and Processing, Activity  Ambrose MantleMareida Grossman-Orr, LCSW 11/03/2013, 12:15pm

## 2013-11-04 DIAGNOSIS — F411 Generalized anxiety disorder: Secondary | ICD-10-CM

## 2013-11-04 MED ORDER — IBUPROFEN 600 MG PO TABS
600.0000 mg | ORAL_TABLET | Freq: Four times a day (QID) | ORAL | Status: DC | PRN
  Administered 2013-11-05: 600 mg via ORAL
  Filled 2013-11-04: qty 1

## 2013-11-04 MED ORDER — DIVALPROEX SODIUM ER 500 MG PO TB24
500.0000 mg | ORAL_TABLET | Freq: Two times a day (BID) | ORAL | Status: DC
  Administered 2013-11-04 – 2013-11-07 (×6): 500 mg via ORAL
  Filled 2013-11-04 (×9): qty 1

## 2013-11-04 NOTE — Progress Notes (Signed)
Adult Psychoeducational Group Note  Date:  11/04/2013 Time:  8:00 PM  Group Topic/Focus:  AA  Participation Level:  Minimal  Participation Quality:  Resistant  Affect:  Irritable and Labile  Cognitive:  Lacking  Insight: Lacking  Engagement in Group:  Lacking  Modes of Intervention:  Limit-setting  Additional Comments:    Humberto SealsWhitaker, Abrahan Fulmore Monique 11/04/2013, 10:27 PM

## 2013-11-04 NOTE — [facility] Group Notes (Signed)
[facility] Group Note   11/04/2013 10:28 AM  Participation Quality:  Labile/Monopolizing but redirectable.   Mood/Affect:  Anxious, Excited, Irritable and Labile  Depression Rating:  7  Anxiety Rating:  10  Thoughts of Suicide:  No Will you contract for safety?   NA  Current AVH:  No  Plan for Discharge/Comments:  Pt interrupted CSW several times demanding to be talked to next. Pt encouraged to wait his turn and told that he would not be overlooked. CSW reminded pt to practice deep breathing or go to his room until he could calm himself down. Pt was redirectable by CSW. [facility] application faxed over the weekend. Pt called [facility] and has interview at 10am Tomorrow (tuesday) with Intake. Pt stated that he feels good about this interview.   Transportation Means: unknown at this time.   Supports: none identified (pt states that his family are his primary triggers)    Smart, OncologistHeather LCSWA

## 2013-11-04 NOTE — Progress Notes (Signed)
Patient ID: Marvetta GibbonsLawrence Adachi, male   DOB: 11/12/1975, 38 y.o.   MRN: [id number] D: Patient has a flat affect on approach this am.  Patient reports that he is in  a lot of pain this am due to injury playing basketball friday. X-ray negative. Probable pulled muscle from incident. Patient getting ultram and ibuprofen prn. Lidocaine patch placed to area this am. Patient finished with detox just wanting on placement. Hoping to get into [facility]. He filled out application already. No SI. A: Staff will continue to monitor on q 15 minute checks, follow treatment plan, and give meds as ordered. R: Cooperative at this time but can be very labile.

## 2013-11-04 NOTE — Progress Notes (Signed)
[facility] MD Progress Note  11/04/2013 4:34 PM Fernando [name]  MRN:  [id number] Subjective:  Fernando [name] stated that he is having a hard time with mood swings, anger. 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 He is afraid he might lose control. He is willing to take whatever medications we think could be helpful to stabilize his mood and help with his anger Diagnosis:   DSM5: Schizophrenia Disorders:  none Obsessive-Compulsive Disorders:  none Trauma-Stressor Disorders:  none Substance/Addictive Disorders:  Alcohol Related Disorder - Severe (303.90), Cocaine use disorder Depressive Disorders:  severe Total Time spent with patient: 30 minutes  Axis I: Generalized Anxiety Disorder  ADL's:  Intact  Sleep: Fair  Appetite:  Fair  Suicidal Ideation:  Plan:  denies Intent:  denies Means:  denies Homicidal Ideation:  Plan:  denies Intent:  denies Means:  denies AEB (as evidenced by):  Psychiatric Specialty Exam: Physical Exam  Review of Systems  Constitutional: Negative.   HENT: Negative.   Eyes: Negative.   Respiratory: Negative.   Cardiovascular: Negative.   Gastrointestinal: Negative.   Genitourinary: Negative.   Musculoskeletal: Positive for back pain.       Muscle spasms  Skin: Negative.   Neurological: Negative.   Endo/Heme/Allergies: Negative.   Psychiatric/Behavioral: Positive for depression and substance abuse. The patient is nervous/anxious.     Blood pressure 123/78, pulse 94, temperature 98.2 F (36.8 C), temperature source Oral, resp. rate 16, height 5\' 7"  (1.702 m), weight 87.091 kg (192 lb).Body mass index is 30.06 kg/(m^2).  General Appearance: Fairly Groomed  Patent attorney::  Fair  Speech:  Clear and Coherent  Volume:  fluctuates  Mood:  Angry, Anxious, Dysphoric and Irritable  Affect:  Labile  Thought Process:  Coherent and Goal Directed  Orientation:  Full (Time, Place, and Person)  Thought Content:  symptoms, worries, concerns, fear of  losing control  Suicidal Thoughts:  No  Homicidal Thoughts:  No  Memory:  Immediate;   Fair Recent;   Fair Remote;   Fair  Judgement:  Fair  Insight:  Shallow  Psychomotor Activity:  Restlessness  Concentration:  Fair  Recall:  [employer] of Knowledge:NA  Language: Fair  Akathisia:  No  Handed:    AIMS (if indicated):     Assets:  Desire for Improvement  Sleep:  Number of Hours: 5.5   Musculoskeletal: Strength & Muscle Tone: within normal limits Gait & Station: normal Patient leans: N/A  Current Medications: Current Facility-Administered Medications  Medication Dose Route Frequency Provider Last Rate Last Dose  . acetaminophen (TYLENOL) tablet 650 mg  650 mg Oral Q4H PRN Shuvon Rankin, NP   650 mg at 11/02/13 2141  . alum & mag hydroxide-simeth (MAALOX/MYLANTA) 200-200-20 MG/5ML suspension 30 mL  30 mL Oral PRN Shuvon Rankin, NP      . busPIRone (BUSPAR) tablet 15 mg  15 mg Oral BID Rachael Fee, MD   15 mg at 11/04/13 0818  . divalproex (DEPAKOTE ER) 24 hr tablet 500 mg  500 mg Oral BID Rachael Fee, MD   500 mg at 11/04/13 1409  . ibuprofen (ADVIL,MOTRIN) tablet 600 mg  600 mg Oral Q8H PRN Shuvon Rankin, NP   600 mg at 11/04/13 4098  . lidocaine (LIDODERM) 5 % 1 patch  1 patch Transdermal Q24H Court Joy, PA-C   1 patch at 11/04/13 [phone]  . magnesium hydroxide (MILK OF MAGNESIA) suspension 30 mL  30 mL Oral Daily PRN Shuvon Rankin,  NP      . methocarbamol (ROBAXIN) tablet 750 mg  750 mg Oral QID PRN Court Joyharles E Kober, PA-C   750 mg at 11/04/13 1257  . multivitamin with minerals tablet 1 tablet  1 tablet Oral Daily Shuvon Rankin, NP   1 tablet at 11/04/13 0818  . nicotine (NICODERM CQ - dosed in mg/24 hours) patch 21 mg  21 mg Transdermal Daily Shuvon Rankin, NP   21 mg at 11/04/13 0629  . OLANZapine zydis (ZYPREXA) disintegrating tablet 5 mg  5 mg Oral TID PRN Rachael FeeIrving A Mavric Cortright, MD   5 mg at 11/04/13 0133  . ondansetron (ZOFRAN) tablet 4 mg  4 mg Oral Q8H PRN Shuvon Rankin, NP       . thiamine (VITAMIN B-1) tablet 100 mg  100 mg Oral Daily Shuvon Rankin, NP   100 mg at 11/04/13 0818  . traMADol (ULTRAM) tablet 50 mg  50 mg Oral Q4H PRN Court Joyharles E Kober, PA-C   50 mg at 11/04/13 1258    Lab Results: No results found for this or any previous visit (from the past 48 hour(s)).  Physical Findings: AIMS: Facial and Oral Movements Muscles of Facial Expression: None, normal Lips and Perioral Area: None, normal Jaw: None, normal Tongue: None, normal,Extremity Movements Upper (arms, wrists, hands, fingers): None, normal Lower (legs, knees, ankles, toes): None, normal, Trunk Movements Neck, shoulders, hips: None, normal, Overall Severity Severity of abnormal movements (highest score from questions above): None, normal Incapacitation due to abnormal movements: None, normal Patient's awareness of abnormal movements (rate only patient's report): No Awareness, Dental Status Current problems with teeth and/or dentures?: No Does patient usually wear dentures?: No  CIWA:  CIWA-Ar Total: 4 COWS:  COWS Total Score: 1  Treatment Plan Summary: Daily contact with patient to assess and evaluate symptoms and progress in treatment Medication management  Plan:  Supportive approach/coping skills/relapse prevention            Trial with Depakote ER 500 mg BID            D/C Celexa                       Medical Decision Making Problem Points:  Established problem, worsening (2) and Review of psycho-social stressors (1) Data Points:  Review of medication regiment & side effects (2) Review of new medications or change in dosage (2)  I certify that inpatient services furnished can reasonably be expected to improve the patient's condition.   Dorothie Wah A 11/04/2013, 4:34 PM

## 2013-11-04 NOTE — Progress Notes (Signed)
Pt has rested all evening but is up now asking for pain medicine. States it is a 10/10. Asking about x-ray results. Shared that no evidence of rib fracture seen. Pt medicated with zydis, ultram and robaxin prn. Will reassess in 1 hour. Denies SI/HI/AVh and denies other needs. Evelyn MarseillesFriedman, Asheley Hellberg Eakes

## 2013-11-04 NOTE — Progress Notes (Addendum)
D: Patient in the hallway on approach.  Patient complaining of lower back pain.  Patient states he was sitting down today and states his back popped.  Patient states ever since it has been in pain.  Patient states the medications he has been given is not working.  Patient refused other interventions such as ice pack.  Patient appears to be attention seeking and intrusive.  Patient exhibiting med-seeking behaviors.  Patient states he has leaned some coping skills why here.  Patient limps when in writer's presence but writer visualized patient walk to 500 hallway with no problem.  Patient denies SI/HI and denies AVH. A: Staff to monitor Q 15 mins for safety.  Encouragement and support offered.  Scheduled medications administered per orders.  R: Patient remains safe on the unit.  Patient attended group tonight.  Patient taking administered medications.  Patient visible on the unit and interacting with peers.

## 2013-11-04 NOTE — [facility] Group Notes (Signed)
[facility] LCSW Group Therapy  11/04/2013 3:35 PM  Type of Therapy:  Group Therapy  Participation Level:  Active  Participation Quality:  Attentive  Affect:  Excited  Cognitive:  Alert and Oriented  Insight:  Improving  Engagement in Therapy:  Improving  Modes of Intervention:  Confrontation, Discussion, Education, Exploration, Problem-solving, Rapport Building, Socialization and Support  Summary of Progress/Problems: Today's Topic: Overcoming Obstacles. Pt identified obstacles faced currently and processed barriers involved in overcoming these obstacles. Pt identified steps necessary for overcoming these obstacles and explored motivation (internal and external) for facing these difficulties head on. Pt further identified one area of concern in their lives and chose a skill of focus pulled from their "toolbox." [name] was attentive and engaged throughout today's therapy group. He shared that "dealing with child support when I don't have a job and trying to get into [facility]" are his biggest anticipated obstacles. [name] shows progress in the group setting and improving insight AEB his ability to process how going to [facility] will help him "become a productive member of society, cope with stress in a healthier way, avoid future relapse, and control my anger and anxiety." Obbie shared that [facility] involves "keeping my mouth shut and not getting into fights with people. I'm really a nice person but I struggle with my anger and impulsiveness."    Smart, Mayerly Kaman LCSWA  11/04/2013, 3:35 PM

## 2013-11-04 NOTE — Progress Notes (Signed)
Pt complaining of Right side pain in LR quadrant. Pt given ice pack and NP   Notified of situation and stated he would assess pt.

## 2013-11-05 DIAGNOSIS — F39 Unspecified mood [affective] disorder: Secondary | ICD-10-CM

## 2013-11-05 MED ORDER — METAXALONE 800 MG PO TABS
800.0000 mg | ORAL_TABLET | Freq: Four times a day (QID) | ORAL | Status: DC
  Administered 2013-11-05 (×2): 800 mg via ORAL
  Filled 2013-11-05 (×10): qty 1

## 2013-11-05 MED ORDER — IBUPROFEN 800 MG PO TABS
800.0000 mg | ORAL_TABLET | Freq: Four times a day (QID) | ORAL | Status: DC | PRN
  Administered 2013-11-05 – 2013-11-10 (×10): 800 mg via ORAL
  Filled 2013-11-05 (×10): qty 1

## 2013-11-05 MED ORDER — METAXALONE 800 MG PO TABS
800.0000 mg | ORAL_TABLET | Freq: Four times a day (QID) | ORAL | Status: DC
  Administered 2013-11-05 – 2013-11-12 (×26): 800 mg via ORAL
  Filled 2013-11-05 (×31): qty 1

## 2013-11-05 MED ORDER — METAXALONE 800 MG PO TABS
800.0000 mg | ORAL_TABLET | Freq: Four times a day (QID) | ORAL | Status: DC
Start: 2013-11-05 — End: 2013-11-05

## 2013-11-05 NOTE — Progress Notes (Signed)
D: Patient in the dayroom on approach.  Patient appears calmer and more cooperative.  Patient states he still has some depression.   Patient states he is feeling much better because his back is not as sore.  Patient states the medications are finally working. Patient mood still labile but does not not as many outbursts. Patient states he had an interview via phone with the program [facility] today.  Patient states he hopes to get into this program.  Patient states if he does not get into the program then he will just go back home.  Patient denies SI/HI and denies AVH.  A: Staff to monitor Q 15 mins for safety.  Encouragement and support offered.  Scheduled medications administered per orders.  P R: Patient remains safe on the unit.  Patient attended group tonight.  Patient visible on the unit.  Patient taking administered medications.

## 2013-11-05 NOTE — [facility] Group Notes (Signed)
[facility] LCSW Group Therapy  11/05/2013 2:59 PM  Type of Therapy:  Group Therapy  Participation Level:  Did Not Attend-pt in room sleeping/refused to attend afternoon group  Smart, Legion Discher LCSWA  11/05/2013, 2:59 PM

## 2013-11-05 NOTE — Progress Notes (Signed)
Adult Psychoeducational Group Note  Date:  11/05/2013 Time:  9:33 PM  Group Topic/Focus:  Wrap-Up Group:   The focus of this group is to help patients review their daily goal of treatment and discuss progress on daily workbooks.  Participation Level:  Active  Participation Quality:  Appropriate  Affect:  Appropriate  Cognitive:  Alert and Oriented  Insight: Appropriate  Engagement in Group:  Developing/Improving  Modes of Intervention:  Clarification, Exploration and Support  Additional Comments:  Patient stated that he has a painful day. Patient stated that he is going to make sure he has a better day tomorrow by not running or playing basketball. Patient stated that he learned from his recovery groups how to care more about himself.  Olie Dibert, Randal Bubaerri Lee 11/05/2013, 9:33 PM

## 2013-11-05 NOTE — Progress Notes (Signed)
[facility] MD Progress Note  11/05/2013 5:34 PM Fernando [name]  MRN:  [id number] Subjective:  Fernando [name] is complaining of a muscle spasm on his back after playing basketball. Otherwise worrying if he was accepted to [facility] or not and if not what other options would he have. He is concerned about not having further structure. Feels the medication is working better for him. He has shared few episodes in which he would have lost his cool but did not. Encouraged by this as he really does want to do better Diagnosis:   DSM5: Schizophrenia Disorders:  none Obsessive-Compulsive Disorders:  none Trauma-Stressor Disorders:  none Substance/Addictive Disorders:  Alcohol Related Disorder - Severe (303.90), Cocaine related disorder Depressive Disorders:  moderate Total Time spent with patient: 30 minutes  Axis I: Generalized Anxiety Disorder and Mood Disorder NOS  ADL's:  Intact  Sleep: Fair  Appetite:  Fair  Suicidal Ideation:  Plan:  denies Intent:  denies Means:  denies Homicidal Ideation:  Plan:  denies Intent:  denies Means:  denies AEB (as evidenced by):  Psychiatric Specialty Exam: Physical Exam  Review of Systems  Constitutional: Negative.   HENT: Negative.   Eyes: Negative.   Respiratory: Negative.   Cardiovascular: Negative.   Gastrointestinal: Negative.   Genitourinary: Negative.   Musculoskeletal: Positive for back pain.  Skin: Negative.   Neurological: Negative.   Endo/Heme/Allergies: Negative.   Psychiatric/Behavioral: Positive for depression and substance abuse. The patient is nervous/anxious.     Blood pressure 130/86, pulse 68, temperature 97.8 F (36.6 C), temperature source Oral, resp. rate 20, height 5\' 7"  (1.702 m), weight 87.091 kg (192 lb).Body mass index is 30.06 kg/(m^2).  General Appearance: Fairly Groomed  Patent attorney::  Fair  Speech:  Clear and Coherent and rapid  Volume:  fluctuates  Mood:  Anxious and Irritable  Affect:  anxious, worried  Thought Process:   Coherent and Goal Directed  Orientation:  Full (Time, Place, and Person)  Thought Content:  somatically focused, symtpoms, worries, concerns, fear of losing control  Suicidal Thoughts:  No  Homicidal Thoughts:  No  Memory:  Immediate;   Fair Recent;   Fair Remote;   Fair  Judgement:  Fair  Insight:  Fair  Psychomotor Activity:  Restlessness  Concentration:  Fair  Recall:  [employer] of Knowledge:NA  Language: Fair  Akathisia:  No  Handed:    AIMS (if indicated):     Assets:  Desire for Improvement  Sleep:  Number of Hours: 5.75   Musculoskeletal: Strength & Muscle Tone: within normal limits Gait & Station: normal Patient leans: N/A  Current Medications: Current Facility-Administered Medications  Medication Dose Route Frequency Provider Last Rate Last Dose  . acetaminophen (TYLENOL) tablet 650 mg  650 mg Oral Q4H PRN Shuvon Rankin, NP   650 mg at 11/04/13 1949  . alum & mag hydroxide-simeth (MAALOX/MYLANTA) 200-200-20 MG/5ML suspension 30 mL  30 mL Oral PRN Shuvon Rankin, NP      . busPIRone (BUSPAR) tablet 15 mg  15 mg Oral BID Rachael Fee, MD   15 mg at 11/05/13 1605  . divalproex (DEPAKOTE ER) 24 hr tablet 500 mg  500 mg Oral BID Rachael Fee, MD   500 mg at 11/05/13 1605  . ibuprofen (ADVIL,MOTRIN) tablet 800 mg  800 mg Oral Q6H PRN Rachael Fee, MD   800 mg at 11/05/13 1659  . lidocaine (LIDODERM) 5 % 1 patch  1 patch Transdermal Q24H Court Joy, PA-C   1  patch at 11/05/13 0810  . magnesium hydroxide (MILK OF MAGNESIA) suspension 30 mL  30 mL Oral Daily PRN Shuvon Rankin, NP      . metaxalone (SKELAXIN) tablet 800 mg  800 mg Oral QID Rachael FeeIrving A Daesha Insco, MD      . multivitamin with minerals tablet 1 tablet  1 tablet Oral Daily Shuvon Rankin, NP   1 tablet at 11/05/13 0809  . nicotine (NICODERM CQ - dosed in mg/24 hours) patch 21 mg  21 mg Transdermal Daily Shuvon Rankin, NP   21 mg at 11/05/13 0809  . OLANZapine zydis (ZYPREXA) disintegrating tablet 5 mg  5 mg Oral TID  PRN Rachael FeeIrving A Jamiyah Dingley, MD   5 mg at 11/04/13 2201  . ondansetron (ZOFRAN) tablet 4 mg  4 mg Oral Q8H PRN Shuvon Rankin, NP      . thiamine (VITAMIN B-1) tablet 100 mg  100 mg Oral Daily Shuvon Rankin, NP   100 mg at 11/05/13 0809  . traMADol (ULTRAM) tablet 50 mg  50 mg Oral Q4H PRN Court Joyharles E Kober, PA-C   50 mg at 11/05/13 1516    Lab Results: No results found for this or any previous visit (from the past 48 hour(s)).  Physical Findings: AIMS: Facial and Oral Movements Muscles of Facial Expression: None, normal Lips and Perioral Area: None, normal Jaw: None, normal Tongue: None, normal,Extremity Movements Upper (arms, wrists, hands, fingers): None, normal Lower (legs, knees, ankles, toes): None, normal, Trunk Movements Neck, shoulders, hips: None, normal, Overall Severity Severity of abnormal movements (highest score from questions above): None, normal Incapacitation due to abnormal movements: None, normal Patient's awareness of abnormal movements (rate only patient's report): No Awareness, Dental Status Current problems with teeth and/or dentures?: No Does patient usually wear dentures?: No  CIWA:  CIWA-Ar Total: 0 COWS:  COWS Total Score: 1  Treatment Plan Summary: Daily contact with patient to assess and evaluate symptoms and progress in treatment Medication management  Plan: Supportive approach/coping skills/relapse prevention           CBT: anger management           Address the muscle spasms           Optimize treatment with psychotropics  Medical Decision Making Problem Points:  Review of psycho-social stressors (1) Data Points:  Review of medication regiment & side effects (2) Review of new medications or change in dosage (2)  I certify that inpatient services furnished can reasonably be expected to improve the patient's condition.   Debbora Ang A 11/05/2013, 5:34 PM

## 2013-11-05 NOTE — Progress Notes (Signed)
Recreation Therapy Notes  Animal-Assisted Activity/Therapy (AAA/T) Program Checklist/Progress Notes Patient Eligibility Criteria Checklist & Daily Group note for Rec Tx Intervention  Date: 03.03.2015 Time: 2:45pm [address]    AAA/T Program Assumption of Risk Form signed by Patient/ or Parent Legal Guardian yes  Patient is free of allergies or sever asthma yes  Patient reports no fear of animals yes  Patient reports no history of cruelty to animals yes   Patient understands his/her participation is voluntary yes  Patient washes hands before animal contact yes  Patient washes hands after animal contact yes  Behavioral Response: Observation, Appropriate   Education: Hand Washing, Appropriate Animal Interaction   Education Outcome: Acknowledges understanding   Clinical Observations/Feedback: Patient attended session, but chose to observe peer interact.   Marykay Lexenise L Roth Ress, LRT/CTRS  Jearl KlinefelterBlanchfield, Temple Ewart L 11/05/2013 4:44 PM

## 2013-11-05 NOTE — Progress Notes (Signed)
Orientation Group Note  Date:  11/05/2013 Time:  0900  Group Topic/Focus:  Recovery Goals:   The focus of this group is to identify appropriate goals for recovery and establish a plan to achieve them.  Participation Level: Did Not Attend  Participation Quality:  Not Applicable  Affect:  Not Applicable  Cognitive:  Not Applicable  Insight:  Not Applicable  Engagement in Group: Not Applicable  Nestor RampDUNCAN, Ulice Follett [facility] 11/05/2013, 11:25 AM

## 2013-11-05 NOTE — Progress Notes (Signed)
D: Patient denies SI/HI and auditory and visual hallucinations. The patient has a labile mood and affect. The patient is demanding and attention seeking throughout the day. The patient is easily agitated and will start shouting and cussing at staff if his demands are not met. The patient is attending some groups throughout the day and interacting within the milieu.  A: Patient given emotional support from RN. Patient encouraged to come to staff with concerns and/or questions. Patient's medication routine continued. Patient's orders and plan of care reviewed.  R: Patient remains cooperative. Will continue to monitor patient q15 minutes for safety.

## 2013-11-06 DIAGNOSIS — F1994 Other psychoactive substance use, unspecified with psychoactive substance-induced mood disorder: Secondary | ICD-10-CM

## 2013-11-06 LAB — VALPROIC ACID LEVEL: Valproic Acid Lvl: 66.2 ug/mL (ref 50.0–100.0)

## 2013-11-06 MED ORDER — BUSPIRONE HCL 15 MG PO TABS
15.0000 mg | ORAL_TABLET | Freq: Once | ORAL | Status: AC
  Administered 2013-11-06: 15 mg via ORAL
  Filled 2013-11-06: qty 1

## 2013-11-06 MED ORDER — BUSPIRONE HCL 15 MG PO TABS: 15.0000 mg | ORAL_TABLET | Freq: Three times a day (TID) | ORAL | Status: DC

## 2013-11-06 MED ORDER — BUSPIRONE HCL 15 MG PO TABS
15.0000 mg | ORAL_TABLET | Freq: Three times a day (TID) | ORAL | Status: DC
  Administered 2013-11-07 – 2013-11-11 (×14): 15 mg via ORAL
  Filled 2013-11-06 (×19): qty 1

## 2013-11-06 MED ORDER — BUSPIRONE HCL 15 MG PO TABS
15.0000 mg | ORAL_TABLET | Freq: Every day | ORAL | Status: DC
  Administered 2013-11-06 – 2013-11-10 (×5): 15 mg via ORAL
  Filled 2013-11-06 (×7): qty 1

## 2013-11-06 NOTE — [facility] Group Notes (Signed)
[facility] LCSW Group Therapy  11/06/2013 3:26 PM  Type of Therapy:  Group Therapy  Participation Level:  Active  Participation Quality:  Attentive  Affect:  Appropriate  Cognitive:  Alert and Oriented  Insight:  Engaged  Engagement in Therapy:  Engaged  Modes of Intervention:  Confrontation, Discussion, Education, Exploration, Problem-solving, Rapport Building, Socialization and Support  Summary of Progress/Problems: Emotion Regulation: This group focused on both positive and negative emotion identification and allowed group members to process ways to identify feelings, regulate negative emotions, and find healthy ways to manage internal/external emotions. Group members were asked to reflect on a time when their reaction to an emotion led to a negative outcome and explored how alternative responses using emotion regulation would have benefited them. Group members were also asked to discuss a time when emotion regulation was utilized when a negative emotion was experienced. Fernando [name] was attentive and engaged throughout today's therapy group. He shared that he struggles with both anger and anxiety. Other group members encouraged pt and provided positive feedback from observations over the past few days about pt's behaviors. [name] shared that he is using the coping skills that he is learning here to help him regulate these negative emotions. "I am doing breathing exercises, praying to my higher power, and walking away when I feel myself getting out of control." "I also apologize to people if I lose my temper." Fernando [name] shows progress in the group setting and improving insight AEB his ability to process how using these coping skills will help him avoid relapse, be successful [facility], and reconnect to family members.    Smart, Betina Puckett LCSWA  11/06/2013, 3:26 PM

## 2013-11-06 NOTE — [facility] Group Notes (Signed)
Adult Psychoeducational Group Note  Date:  11/06/2013 Time:  9:55 PM  Group Topic/Focus:  AA Meeting  Participation Level:  Did Not Attend  Participation Quality:  None  Affect:  None  Cognitive:  None  Insight: None  Engagement in Group:  None  Modes of Intervention:  Discussion and Education  Additional Comments:  [name] did not attend group.  Caroll RancherLindsay, Casten Floren A 11/06/2013, 9:55 PM

## 2013-11-06 NOTE — [facility] Group Notes (Signed)
[facility]   11/06/2013 10:12 AM  Participation Quality:  Appropriate   Mood/Affect:  Anxious and Excited  Depression Rating:  0  Anxiety Rating:  10-due to uncertainty regarding [facility] application   Thoughts of Suicide:  No Will you contract for safety?   NA  Current AVH:  No  Plan for Discharge/Comments:  Pt reports that he is doing well but is nervous about not getting into [facility]. CSW and pt to call after group. Pt not accepted into [facility]-behavioral issues; refusing referral to [facility] and [facility] at this time.   Transportation Means:  Bus   Supports: none   Smart, OncologistHeather LCSWA

## 2013-11-06 NOTE — Progress Notes (Addendum)
[facility]  11/06/2013 1:42 PM Fernando GibbonsLawrence [name]  MRN:  [id number] Subjective:  Fernando BishopLawrence has continued to improve. He is tolerating his medications well. He is interacting with other patients. He states he is really committed to make this work. [facility]. He is looking forward to being in a long term structured program where he can get his life back together.  Diagnosis:   DSM5: Schizophrenia Disorders:  Denies Obsessive-Compulsive Disorders:  Denies Trauma-Stressor Disorders:  Denies Substance/Addictive Disorders:  Alcohol Related Disorder - Severe (303.90), Cocaine Related Disorder Depressive Disorders:  Major Depressive Disorder - Moderate (296.22) Total Time spent with patient: 30 minutes  Axis I: Mood Disorder NOS and Substance Induced Mood Disorder  ADL's:  Intact  Sleep: Fair  Appetite:  Fair  Suicidal Ideation:  Plan:  denies Intent:  denies Means:  denies Homicidal Ideation:  Plan:  denies Intent:  denies Means:  denies AEB (as evidenced by):  Psychiatric Specialty Exam: Physical Exam  Review of Systems  Constitutional: Negative.   HENT: Negative.   Eyes: Negative.   Respiratory: Negative.   Cardiovascular: Negative.   Gastrointestinal: Negative.   Genitourinary: Negative.   Musculoskeletal:       Spasm is better  Skin: Negative.   Neurological: Negative.   Endo/Heme/Allergies: Negative.   Psychiatric/Behavioral: Positive for substance abuse. The patient is nervous/anxious.     Blood pressure 143/85, pulse 78, temperature 97.1 F (36.2 C), temperature source Oral, resp. rate 20, height 5\' 7"  (1.702 m), weight 87.091 kg (192 lb).Body mass index is 30.06 kg/(m^2).  General Appearance: Fairly Groomed  Patent attorneyye Contact::  Fair  Speech:  Clear and Coherent  Volume:  Normal  Mood:  Anxious and worried  Affect:  anxious, worried  Thought Process:  Coherent and Goal Directed  Orientation:  Full (Time, Place, and Person)  Thought  Content:  plans, goals  Suicidal Thoughts:  No  Homicidal Thoughts:  No  Memory:  Immediate;   Fair Recent;   Fair Remote;   Fair  Judgement:  Fair  Insight:  Present  Psychomotor Activity:  Restlessness  Concentration:  Fair  Recall:  FiservFair  Fund of Knowledge:Fair  Language: Fair  Akathisia:  No  Handed:    AIMS (if indicated):     Assets:  Desire for Improvement  Sleep:  Number of Hours: 6.75   Musculoskeletal: Strength & Muscle Tone: within normal limits Gait & Station: normal Patient leans: N/A  Current Medications: Current Facility-Administered Medications  Medication Dose Route Frequency Provider Last Rate Last Dose  . acetaminophen (TYLENOL) tablet 650 mg  650 mg Oral Q4H PRN Shuvon Rankin, NP   650 mg at 11/04/13 1949  . alum & mag hydroxide-simeth (MAALOX/MYLANTA) 200-200-20 MG/5ML suspension 30 mL  30 mL Oral PRN Shuvon Rankin, NP      . busPIRone (BUSPAR) tablet 15 mg  15 mg Oral BID Rachael FeeIrving A Jawann Urbani, MD   15 mg at 11/06/13 0748  . divalproex (DEPAKOTE ER) 24 hr tablet 500 mg  500 mg Oral BID Rachael FeeIrving A Yuliza Cara, MD   500 mg at 11/06/13 0749  . ibuprofen (ADVIL,MOTRIN) tablet 800 mg  800 mg Oral Q6H PRN Rachael FeeIrving A Bellamia Ferch, MD   800 mg at 11/06/13 1131  . lidocaine (LIDODERM) 5 % 1 patch  1 patch Transdermal Q24H Court Joyharles E Kober, PA-C   1 patch at 11/06/13 [phone] [id number]  . magnesium hydroxide (MILK OF MAGNESIA) suspension 30 mL  30 mL Oral Daily PRN  Shuvon Rankin, NP      . metaxalone (SKELAXIN) tablet 800 mg  800 mg Oral QID Rachael Fee, MD   800 mg at 11/06/13 1131  . multivitamin with minerals tablet 1 tablet  1 tablet Oral Daily Shuvon Rankin, NP      . nicotine (NICODERM CQ - dosed in mg/24 hours) patch 21 mg  21 mg Transdermal Daily Shuvon Rankin, NP   21 mg at 11/06/13 0746  . OLANZapine zydis (ZYPREXA) disintegrating tablet 5 mg  5 mg Oral TID PRN Rachael Fee, MD   5 mg at 11/05/13 2129  . ondansetron (ZOFRAN) tablet 4 mg  4 mg Oral Q8H PRN Shuvon Rankin, NP      . thiamine  (VITAMIN B-1) tablet 100 mg  100 mg Oral Daily Shuvon Rankin, NP   100 mg at 11/06/13 0748  . traMADol (ULTRAM) tablet 50 mg  50 mg Oral Q4H PRN Court Joy, PA-C   50 mg at 11/05/13 1516    Lab Results: No results found for this or any previous visit (from the past 48 hour(s)).  Physical Findings: AIMS: Facial and Oral Movements Muscles of Facial Expression: None, normal Lips and Perioral Area: None, normal Jaw: None, normal Tongue: None, normal,Extremity Movements Upper (arms, wrists, hands, fingers): None, normal Lower (legs, knees, ankles, toes): None, normal, Trunk Movements Neck, shoulders, hips: None, normal, Overall Severity Severity of abnormal movements (highest score from questions above): None, normal Incapacitation due to abnormal movements: None, normal Patient's awareness of abnormal movements (rate only patient's report): No Awareness, Dental Status Current problems with teeth and/or dentures?: No Does patient usually wear dentures?: No  CIWA:  CIWA-Ar Total: 0 COWS:  COWS Total Score: 1  Treatment Plan Summary: Daily contact with patient to assess and evaluate symptoms and progress in treatment Medication management  Plan: Supportive approach/copign skills/relapse prevention           Continue meds           Depakote level  Medical Decision Making Problem Points:  Review of psycho-social stressors (1) Data Points:  Review of medication regiment & side effects (2)  I certify that inpatient services furnished can reasonably be expected to improve the patient's condition.   Tanny Harnack A 11/06/2013, 1:42 PM

## 2013-11-06 NOTE — Progress Notes (Signed)
Patient ID: Fernando GibbonsLawrence Lung, male   DOB: 11/04/1975, 38 y.o.   MRN: [id number] He has been less agitated,needy today. Has requested pain medication once today at 11:31 that was effective.Self inventory: depression 3,hopaelssness 4 denies withdrawal symptoms and SI thoughts.

## 2013-11-06 NOTE — Clinical Social Work Note (Signed)
CSW and pt called his child support agent this morning [facility]([place]) to request letter stating that they are aware of pt's potential admission into [facility]. CSW confirmed with [facility] that pt did not have to get court order to have child support stopped, but rather, that letter stating they knew he was coming to program was acceptable-[facility]. [place] faxed letter per CSW request. CSW faxed this to [facility](attn Melissa) and requested admission date prior to sending required psych evaluation. Pt must have 60 day med supply if accepted. Pt also has transportation issue--no money, no family/friend supports. Pt anxious about how [facility] will get [facility] if [facility]. CSW assessing and waiting for Melissa to review information and provide pt with tentative admission date. Fax confirmations given to pt for his records.  The Sherwin-WilliamsHeather Smart, LCSWA 11/06/2013 12:35 PM

## 2013-11-06 NOTE — Tx Team (Signed)
Interdisciplinary Treatment Plan Update (Adult)  Date: 11/06/2013   Time Reviewed: 11:04 AM  Progress in Treatment:  Attending groups: Yes  Participating in groups:  Yes  Taking medication as prescribed: Yes  Tolerating medication: Yes  Family/Significant othe contact made: No. Pt did not consent to family contact. SPE completed with pt.   Patient understands diagnosis: Yes, AEB seeking treatmet for AVH, mood stabilization, ETOH detox, SI, and medication management.  Discussing patient identified problems/goals with staff: Yes  Medical problems stabilized or resolved: Yes  Denies suicidal/homicidal ideation: Yes during group/self report.   Patient has not harmed self or Others: Yes  New problem(s) identified: Pt is positive for AVH this morning.  Discharge Plan or Barriers: [facility]. He is refusing referral to [facility] house or [facility]. Pt set on [facility] referral. [facility] requires psych evaluation from Dr. Dub MikesLugo clearing pt for admission into program AND letter from Child support case worker stating that they are aware he is going [facility]. CSW spoke with case worker and she was faxed proof that pt was tentatively accepted into program. She stated that she would fax back letter per CSW request. Pt also needs 60- day med supply and transportation to [facility] (if accepted). He has no supports/no income or money. Pt visibly anxious about getting to facility if accepted due to lack of resources and support.   Additional comments: Reason for Continuation of Hospitalization: Medication management Mood stabilization Estimated length of stay: 2-3 days  For review of initial/current patient goals, please see plan of care.  Attendees:  Patient:    Family:    Physician: Dr. Dub MikesLugo MD 11/06/2013 11:07 AM   Nursing: Victorino DikeJennifer RN 11/06/2013 11:04 AM   Clinical Social Worker Zerline Melchior Smart, LCSWA  11/06/2013 11:04 AM   Other:  11/06/2013 11:04 AM   Other: Aggie N. PA 11/06/2013 11:04  AM   Other:    Other:    Scribe for Treatment Team:  The Sherwin-WilliamsHeather Smart LCSWA 11/06/2013 11:04 AM

## 2013-11-06 NOTE — Progress Notes (Signed)
D Pt. Denies SI and HI, no complaints of pain or discomfort noted.  A Writer offered support and encouragement  R Pt. Remains safe on the unit.  Pt. Is argumentative, demanding to go to the opposite hall, even running down the hall like a child laughing at staff, pt. Is very child like.  Pt. Is hoping to discharge tomorrow.

## 2013-11-07 MED ORDER — DIVALPROEX SODIUM ER 500 MG PO TB24
500.0000 mg | ORAL_TABLET | Freq: Three times a day (TID) | ORAL | Status: DC
  Administered 2013-11-07 – 2013-11-12 (×15): 500 mg via ORAL
  Filled 2013-11-07: qty 1
  Filled 2013-11-07: qty 180
  Filled 2013-11-07 (×6): qty 1
  Filled 2013-11-07 (×2): qty 180
  Filled 2013-11-07 (×11): qty 1

## 2013-11-07 NOTE — Progress Notes (Signed)
The focus of this group is to educate the patient on the purpose and policies of crisis stabilization and provide a format to answer questions about their admission.  The group details unit policies and expectations of patients while admitted.    Pt was in group but left and came back once.  He was appropriate in this group and was an active participant his goal today "keep my attitude in check and work on my discharge plan"

## 2013-11-07 NOTE — Progress Notes (Signed)
Pt was asleep in dayroom upon first assessment.  He had to be aroused by voice and light touch for his am medication.  He did take them at 0843.  He denied depression or anxiety and rated his hopelessness a 3 on his self-inventory.  He is still hoping to get into [facility] [place].  He requested his prn Ibuprofen at 1009 and Dr. Dub MikesLugo increased his depakote to TID.  He denied any S/H ideation or A/V/H.

## 2013-11-07 NOTE — Progress Notes (Signed)
D: Patient resting in bed with eyes closed.  Respirations even and unlabored.  Patient appears to be in no apparent distress. A: Staff to monitor Q 15 mins for safety.   R:Patient remains safe on the unit.  

## 2013-11-07 NOTE — Progress Notes (Signed)
[facility] Group Notes:  (Nursing/MHT/Case Management/Adjunct)  Date:  11/07/2013  Time:  2100 Type of Therapy:  wrap up group  Participation Level:  None  Participation Quality:  Drowsy  Affect:  sleepy  Cognitive:  dreaming  Insight:  None  Engagement in Group:  sleeping  Modes of Intervention:  Clarification, Education and Support  Summary of Progress/Problems: Pt slept during group time.   Shelah LewandowskySquires, Jessina Marse Carol 11/07/2013, 10:26 PM

## 2013-11-07 NOTE — Progress Notes (Signed)
[facility]  11/07/2013 3:42 PM Fernando [name]  MRN:  [id number] Subjective:  Depakote level was 57 requested an increase as feels it wearing off in the afternoon. Looking forward to go to [facility]. 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 On listening, precessing before reacting. He has been participating appropriately in group. Expresses he is very motivated to pursue further work on himself, short and long term goals Diagnosis:   DSM5: Schizophrenia Disorders:  none Obsessive-Compulsive Disorders:  none Trauma-Stressor Disorders:  none Substance/Addictive Disorders:  Alcohol Related Disorder - Severe (303.90), Cocaine related disorder Depressive Disorders:  Major Depressive Disorder - Moderate (296.22) Total Time spent with patient: 30 minutes  Axis I: Generalized Anxiety Disorder, Mood Disorder NOS and Substance Induced Mood Disorder  ADL's:  Intact  Sleep: Fair  Appetite:  Fair  Suicidal Ideation:  Plan:  denies Intent:  denies Means:  denies Homicidal Ideation:  Plan:  denies Intent:  denies Means:  denies AEB (as evidenced by):  Psychiatric Specialty Exam: Physical Exam  Review of Systems  Constitutional: Negative.   HENT: Negative.   Eyes: Negative.   Respiratory: Negative.   Cardiovascular: Negative.   Gastrointestinal: Negative.   Genitourinary: Negative.   Musculoskeletal: Negative.   Skin: Negative.   Neurological: Negative.   Endo/Heme/Allergies: Negative.   Psychiatric/Behavioral: Positive for substance abuse.    Blood pressure 130/82, pulse 77, temperature 97.6 F (36.4 C), temperature source Oral, resp. rate 18, height 5\' 7"  (1.702 m), weight 87.091 kg (192 lb).Body mass index is 30.06 kg/(m^2).  General Appearance: Fairly Groomed  Patent attorneyye Contact::  Fair  Speech:  Clear and Coherent  Volume:  fluctuates  Mood:  Anxious and worried, anticipating [facility] calling  Affect:  Appropriate  Thought Process:  Coherent and Goal Directed   Orientation:  Full (Time, Place, and Person)  Thought Content:  worries, concerns  Suicidal Thoughts:  No  Homicidal Thoughts:  No  Memory:  Immediate;   Fair Recent;   Fair Remote;   Fair  Judgement:  Fair  Insight:  Present and Shallow  Psychomotor Activity:  Restlessness  Concentration:  Fair  Recall:  FiservFair  Fund of Knowledge:N/A  Language: Fair  Akathisia:  No  Handed:    AIMS (if indicated):     Assets:  Desire for Improvement  Sleep:  Number of Hours: 5   Musculoskeletal: Strength & Muscle Tone: within normal limits Gait & Station: normal Patient leans: N/A  Current Medications: Current Facility-Administered Medications  Medication Dose Route Frequency Provider Last Rate Last Dose  . acetaminophen (TYLENOL) tablet 650 mg  650 mg Oral Q4H PRN Shuvon Rankin, NP   650 mg at 11/04/13 1949  . alum & mag hydroxide-simeth (MAALOX/MYLANTA) 200-200-20 MG/5ML suspension 30 mL  30 mL Oral PRN Shuvon Rankin, NP      . busPIRone (BUSPAR) tablet 15 mg  15 mg Oral TID Rachael FeeIrving A Drue Camera, MD   15 mg at 11/07/13 1155  . busPIRone (BUSPAR) tablet 15 mg  15 mg Oral QHS Rachael FeeIrving A Sujay Grundman, MD   15 mg at 11/06/13 2242  . divalproex (DEPAKOTE ER) 24 hr tablet 500 mg  500 mg Oral TID AC Rachael FeeIrving A Genean Adamski, MD   500 mg at 11/07/13 1155  . ibuprofen (ADVIL,MOTRIN) tablet 800 mg  800 mg Oral Q6H PRN Rachael FeeIrving A Curstin Schmale, MD   800 mg at 11/07/13 1009  . lidocaine (LIDODERM) 5 % 1 patch  1 patch Transdermal Q24H Court Joyharles E Kober, PA-C  1 patch at 11/07/13 0842  . magnesium hydroxide (MILK OF MAGNESIA) suspension 30 mL  30 mL Oral Daily PRN Shuvon Rankin, NP      . metaxalone (SKELAXIN) tablet 800 mg  800 mg Oral QID Rachael Fee, MD   800 mg at 11/07/13 1155  . multivitamin with minerals tablet 1 tablet  1 tablet Oral Daily Shuvon Rankin, NP   1 tablet at 11/07/13 0843  . nicotine (NICODERM CQ - dosed in mg/24 hours) patch 21 mg  21 mg Transdermal Daily Shuvon Rankin, NP   21 mg at 11/07/13 0631  . OLANZapine zydis  (ZYPREXA) disintegrating tablet 5 mg  5 mg Oral TID PRN Rachael Fee, MD   5 mg at 11/06/13 2242  . ondansetron (ZOFRAN) tablet 4 mg  4 mg Oral Q8H PRN Shuvon Rankin, NP      . thiamine (VITAMIN B-1) tablet 100 mg  100 mg Oral Daily Shuvon Rankin, NP   100 mg at 11/07/13 1610    Lab Results:  Results for orders placed during the hospital encounter of 10/30/13 (from the past 48 hour(s))  VALPROIC ACID LEVEL     Status: None   Collection Time    11/06/13  7:46 PM      Result Value Ref Range   Valproic Acid Lvl 66.2  50.0 - 100.0 ug/mL   Comment: Performed at [facility]    Physical Findings: AIMS: Facial and Oral Movements Muscles of Facial Expression: None, normal Lips and Perioral Area: None, normal Jaw: None, normal Tongue: None, normal,Extremity Movements Upper (arms, wrists, hands, fingers): None, normal Lower (legs, knees, ankles, toes): None, normal, Trunk Movements Neck, shoulders, hips: None, normal, Overall Severity Severity of abnormal movements (highest score from questions above): None, normal Incapacitation due to abnormal movements: None, normal Patient's awareness of abnormal movements (rate only patient's report): No Awareness, Dental Status Current problems with teeth and/or dentures?: No Does patient usually wear dentures?: No  CIWA:  CIWA-Ar Total: 0 COWS:  COWS Total Score: 1  Treatment Plan Summary: Daily contact with patient to assess and evaluate symptoms and progress in treatment Medication management  Plan: Supportive approach/coping skills/relapse prevention           Continue medications/increase the Depakote to 500 mg TID  Medical Decision Making Problem Points:  Review of psycho-social stressors (1) Data Points:  Review of new medications or change in dosage (2)  I certify that inpatient services furnished can reasonably be expected to improve the patient's condition.   Cannon Arreola A 11/07/2013, 3:42 PM

## 2013-11-07 NOTE — [facility] Group Notes (Signed)
[facility] LCSW Group Therapy  11/07/2013 3:35 PM  Type of Therapy:  Group Therapy  Participation Level:  Active  Participation Quality:  Attentive  Affect:  Appropriate and Excited  Cognitive:  Alert and Oriented  Insight:  Improving  Engagement in Therapy:  Engaged and Improving  Modes of Intervention:  Confrontation, Discussion, Education, Exploration, Problem-solving, Rapport Building, Socialization and Support  Summary of Progress/Problems:  Finding Balance in Life. Today's group focused on defining balance in one's own words, identifying things that can knock one off balance, and exploring healthy ways to maintain balance in life. Group members were asked to provide an example of a time when they felt off balance, describe how they handled that situation,and process healthier ways to regain balance in the future. Group members were asked to share the most important tool for maintaining balance that they learned [facility] and how they plan to apply this method after discharge. Fernando [name] was attentive and engaged throughout today's therapy group. He shared that for him, a balanced life meant "working, having clean fun, having a family, goals, and being physically healthy." [name] shows progress in the group setting and improving insight AEB his ability to process how going to [facility] will help him gain the structure and skills that he needs to lead a life where he can meet these goals.    Fernando [name], Fernando [name] LCSWA  11/07/2013, 3:35 PM

## 2013-11-07 NOTE — Clinical Social Work Note (Signed)
CSW spoke with intake coordinator at 3:45PM. They are currently in review process and will have an answer by tomorrow AM. CSW spoke with tech who will relay message to pt. Intake coordinator was given name and [facility] info [facility], as she will be working with pt on Friday.  The Sherwin-WilliamsHeather Smart, LCSWA 11/07/2013 4:00 PM

## 2013-11-07 NOTE — Progress Notes (Signed)
D: Patient in the hallway on approach.  Patient states he had a better day.  Patient state she thinks his medications are working.  Patient states his moods have been better.  Patient states he did get frustrated today because he still is unsure [facility].  Patient states he is waiting to hear and hopes to hear good news tomorrow.  Patient denies SI/HI and denies AVH. A: Staff to monitor Q 15 mins for safety.  Encouragement and support offered.  Scheduled medications administered per orders.  Ibuprofen administered prn for back pain.  Zyprexa administered for agitation. R: Patient remains safe on the unit.  Patient slept during group tonight.  Patient visible on the unit and interacting with peers.  Patient taking administered medications.

## 2013-11-07 NOTE — Progress Notes (Signed)
Recreation Therapy Notes  Animal-Assisted Activity/Therapy (AAA/T) Program Checklist/Progress Notes Patient Eligibility Criteria Checklist & Daily Group note for Rec Tx Intervention  Date: 03.05.2015 Time: 2:45pm [address]   AAA/T Program Assumption of Risk Form signed by Patient/ or Parent Legal Guardian yes  Patient is free of allergies or sever asthma yes  Patient reports no fear of animals yes  Patient reports no history of cruelty to animals yes   Patient understands his/her participation is voluntary yes  Patient washes hands before animal contact yes  Patient washes hands after animal contact yes  Behavioral Response: Observation   Education: Hand Washing, Appropriate Animal Interaction   Education Outcome: Acknowledges understanding  Clinical Observations/Feedback: Patient attended group session, but chose not to interact with AAA dog team.  Patient appropriate during session.   Marykay Lexenise L Erleen Egner, LRT/CTRS  Jearl KlinefelterBlanchfield, Lealon Vanputten L 11/07/2013 8:19 PM

## 2013-11-08 NOTE — [facility] Group Notes (Signed)
Adult Psychoeducational Group Note  Date:  11/08/2013 Time:  10:46 PM  Group Topic/Focus:  AA Meeting  Participation Level:  Did Not Attend  Participation Quality:  None  Affect:  None  Cognitive:  None  Insight: None  Engagement in Group:  None  Modes of Intervention:  Discussion and Education  Additional Comments:  [name] did not attend group.  Caroll RancherLindsay, Markeise Mathews A 11/08/2013, 10:46 PM

## 2013-11-08 NOTE — [facility] Group Notes (Signed)
[facility] LCSW Group Therapy  11/08/2013  1:15 PM   Type of Therapy:  Group Therapy  Participation Level:  Active  Participation Quality:  Attentive, Sharing and Supportive  Affect:  Depressed and Flat  Cognitive:  Alert and Oriented  Insight:  Developing/Improving and Engaged  Engagement in Therapy:  Developing/Improving and Engaged  Modes of Intervention:  Clarification, Confrontation, Discussion, Education, Exploration, Limit-setting, Orientation, Problem-solving, Rapport Building, Dance movement psychotherapisteality Testing, Socialization and Support  Summary of Progress/Problems: The topic for today was feelings about relapse.  Pt discussed what relapse prevention is to them and identified triggers that they are on the path to relapse.  Pt processed their feeling towards relapse and was able to relate to peers.  Pt discussed coping skills that can be used for relapse prevention.  Pt came at the end of group due to meeting with the MD but actively listened to group discussion.     Fernando IvanChelsea Horton, LCSW 11/08/2013 3:08 PM

## 2013-11-08 NOTE — Progress Notes (Signed)
Pt resting in bed with eyes closed. No complaints voiced and no distress noted. Level III obs in place for safety and pt is safe.Fernando MarseillesFriedman, Fernando [name]

## 2013-11-08 NOTE — Progress Notes (Signed)
Adult Psychoeducational Group Note  Date:  11/08/2013 Time:  11:34 AM  Group Topic/Focus:  Relapse Prevention Planning:   The focus of this group is to define relapse and discuss the need for planning to combat relapse.  Affect:  Angry, Defensive, Irritable and Resistant  Cognitive:  Alert  Insight: Lacking  Modes of Intervention:  Discussion and Education  Additional Comments:  Pt was woken up at begining of group as he was sleeping on the small sofa in the dayroom. Pt became visibly upset that this writer did not wake him up for snack. Pt stood and asked what the group was going to be on and this writer told him Relapse and Recovery. Pt stated "Doctors purposely put you on medication to get you addicted so they can keep getting paid!" Pt continued to stand and point at this writer and tell that the workbooks that are handed out aren't worth anything as they don't have any valuable information in them. Pt also stated that "talking in group is what I am here for! I never get to talk and no one else does either."   Dalia HeadingSeeley, Heaton Sarin Aileen 11/08/2013, 11:34 AM

## 2013-11-08 NOTE — Progress Notes (Signed)
D) Pt continues to be labile and will get upset very easily with his peers if he is hearing something he doesn't like. Pt is loud and at times boisterous. Is intrusive and demanding but less so then he has been in the past. Pt rates his depression at a 3 and his hopelessness at a 5. Denies SI and HI. Has been making phone calls all day trying to find his placement so he does not have to stay the weekend here.  A) Given support, reassurance and praise when appropriate. Encouraged to not call the facility too much and to wait until they return his call. Encouraged to work the program here and to remain cooperative and invested in the program. Given firm, gentle limits. Treated with high respect. R) Denies SI and HI.

## 2013-11-08 NOTE — Tx Team (Signed)
Interdisciplinary Treatment Plan Update (Adult)  Date: 11/08/2013  Time Reviewed:  9:45 AM  Progress in Treatment: Attending groups: Yes Participating in groups:  Yes Taking medication as prescribed:  Yes Tolerating medication:  Yes Family/Significant othe contact made: No, pt refused Patient understands diagnosis:  Yes Discussing patient identified problems/goals with staff:  Yes Medical problems stabilized or resolved:  Yes Denies suicidal/homicidal ideation: Yes Issues/concerns per patient self-inventory:  Yes Other:  New problem(s) identified: N/A  Discharge Plan or Barriers: [facility]    Reason for Continuation of Hospitalization: Anxiety Depression Medication Stabilization Placement  Comments: N/A  Estimated length of stay: 2-3 days  For review of initial/current patient goals, please see plan of care.  Attendees: Patient:     Family:     Physician:  Dr. Dub MikesLugo 11/08/2013 10:23 AM   Nursing:   Lamount Crankerhris Judge, RN 11/08/2013 10:23 AM   Clinical Social Worker:  Reyes Ivanhelsea Horton, LCSW 11/08/2013 10:23 AM   Other: Onnie BoerJennifer Clark, RN case manager 11/08/2013 10:23 AM   Other:  Trula SladeHeather Smart, LCSWA 11/08/2013 10:23 AM   Other:  Serena ColonelAggie Nwoko, NP 11/08/2013 10:23 AM   Other:  Tomasita Morrowelora Sutton, care coordination 11/08/2013 10:23 AM   Other:    Other:    Other:    Other:    Other:    Other:     Scribe for Treatment Team:   Carmina MillerHorton, Jamira Barfuss Nicole, 11/08/2013 , 10:23 AM

## 2013-11-08 NOTE — [facility] Group Notes (Signed)
[facility]   11/08/2013 8:45 AM  Participation Quality:  Minimal  Mood/Affect:  Drowsy - pt slept for the duration of group  Depression Rating:  unknown  Anxiety Rating:  unknown  Thoughts of Suicide:  unknown  Will you contract for safety?   Yes  Current AVH:  Pt denies  Plan for Discharge/Comments:  Pt slept for the duration of group.  [facility] for treatment/[facility].  No further needs voiced by pt at this time.    Transportation Means: Unknown  Supports: No supports mentioned at this time  Reyes IvanChelsea Horton, LCSW 11/08/2013 10:01 AM

## 2013-11-08 NOTE — Progress Notes (Signed)
Patient ID: Fernando [name], male   DOB: 03-29-1976, 38 y.o.   MRN: [id number] [name] MD Progress Note  11/08/2013 2:07 PM Fernando [name]  MRN:  [id number]  Subjective:  Fernando [name] says today that he is tired of group sessions that talk about drugs, shooting up heroin and doing crack.  He says, "I just drink, I'm not a drug addict. Why do I have listen to all the talks about doing, using and shooting up. These kind of drug talk gets my mood down. I want to hear about mental illness and how to stay sober".  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  Psychiatric Specialty Exam: Physical Exam  Review of Systems  Constitutional: Negative.   HENT: Negative.   Eyes: Negative.   Respiratory: Negative.   Cardiovascular: Negative.   Gastrointestinal: Negative.   Genitourinary: Negative.   Musculoskeletal: Negative.   Skin: Negative.   Neurological: Negative.   Endo/Heme/Allergies: Negative.   Psychiatric/Behavioral: Positive for substance abuse.    Blood pressure 136/84, pulse 76, temperature 97.8 F (36.6 C), temperature source Oral, resp. rate 16, height 5\' 7"  (1.702 m), weight 87.091 kg (192 lb).Body mass index is 30.06 kg/(m^2).  General Appearance: Fairly Groomed  Patent attorney::  Fair  Speech:  Clear and Coherent  Volume:  fluctuates  Mood:  Anxious and worried, anticipating  [facility] calling  Affect:  Appropriate  Thought Process:  Coherent and Goal Directed  Orientation:  Full (Time, Place, and Person)  Thought Content:  worries, concerns  Suicidal Thoughts:  No  Homicidal Thoughts:  No  Memory:  Immediate;   Fair Recent;   Fair Remote;   Fair  Judgement:  Fair  Insight:  Present and Shallow  Psychomotor Activity:  Restlessness  Concentration:  Fair  Recall:  [employer] of Knowledge:N/A  Language: Fair  Akathisia:  No  Handed:    AIMS (if indicated):     Assets:  Desire for Improvement  Sleep:  Number of Hours: 5.75   Musculoskeletal: Strength & Muscle Tone: within normal limits Gait & Station: normal Patient leans: N/A  Current Medications: Current Facility-Administered Medications  Medication Dose Route Frequency Provider Last Rate Last Dose  . acetaminophen (TYLENOL) tablet 650 mg  650 mg Oral Q4H PRN Shuvon Rankin, NP   650 mg at 11/04/13 1949  . alum & mag hydroxide-simeth (MAALOX/MYLANTA) 200-200-20 MG/5ML suspension 30 mL  30 mL Oral PRN Shuvon Rankin, NP      . busPIRone (BUSPAR) tablet 15 mg  15 mg Oral TID Rachael Fee, MD   15 mg at 11/08/13 1158  . busPIRone (BUSPAR) tablet 15 mg  15 mg Oral QHS Rachael Fee, MD   15 mg at 11/07/13 2111  . divalproex (DEPAKOTE ER) 24 hr tablet 500 mg  500 mg Oral TID AC Rachael Fee, MD   500 mg at 11/08/13 1158  . ibuprofen (ADVIL,MOTRIN) tablet 800 mg  800 mg Oral  Q6H PRN Rachael FeeIrving A Lugo, MD   800 mg at 11/08/13 0746  . lidocaine (LIDODERM) 5 % 1 patch  1 patch Transdermal Q24H Court Joyharles E Kober, PA-C   1 patch at 11/08/13 [phone]  . magnesium hydroxide (MILK OF MAGNESIA) suspension 30 mL  30 mL Oral Daily PRN Shuvon Rankin, NP      . metaxalone (SKELAXIN) tablet 800 mg  800 mg Oral QID Rachael FeeIrving A Lugo, MD   800 mg at 11/08/13 1158  . multivitamin with minerals tablet 1 tablet  1 tablet Oral Daily Shuvon Rankin, NP   1 tablet at 11/08/13 0743  . nicotine (NICODERM CQ - dosed in mg/24 hours) patch 21 mg  21  mg Transdermal Daily Shuvon Rankin, NP   21 mg at 11/08/13 0534  . OLANZapine zydis (ZYPREXA) disintegrating tablet 5 mg  5 mg Oral TID PRN Rachael FeeIrving A Lugo, MD   5 mg at 11/07/13 2141  . ondansetron (ZOFRAN) tablet 4 mg  4 mg Oral Q8H PRN Shuvon Rankin, NP      . thiamine (VITAMIN B-1) tablet 100 mg  100 mg Oral Daily Shuvon Rankin, NP   100 mg at 11/08/13 [id number]    Lab Results:  Results for orders placed during the hospital encounter of 10/30/13 (from the past 48 hour(s))  VALPROIC ACID LEVEL     Status: None   Collection Time    11/06/13  7:46 PM      Result Value Ref Range   Valproic Acid Lvl 66.2  50.0 - 100.0 ug/mL   Comment: [facility]    Physical Findings: AIMS: Facial and Oral Movements Muscles of Facial Expression: None, normal Lips and Perioral Area: None, normal Jaw: None, normal Tongue: None, normal,Extremity Movements Upper (arms, wrists, hands, fingers): None, normal Lower (legs, knees, ankles, toes): None, normal, Trunk Movements Neck, shoulders, hips: None, normal, Overall Severity Severity of abnormal movements (highest score from questions above): None, normal Incapacitation due to abnormal movements: None, normal Patient's awareness of abnormal movements (rate only patient's report): No Awareness, Dental Status Current problems with teeth and/or dentures?: No Does patient usually wear dentures?: No  CIWA:  CIWA-Ar Total: 0 COWS:  COWS Total Score: 1  Treatment Plan Summary: Daily contact with patient to assess and evaluate symptoms and progress in treatment Medication management  Plan: Supportive approach/coping skills/relapse prevention Continue Depakote at 500 mg TID. Discharge plans in place  Medical Decision Making Problem Points:  Review of psycho-social stressors (1) Data Points:  Review of new medications or change in dosage (2)  I certify that inpatient services furnished can reasonably be expected to improve the patient's  condition.   Armandina Stammerwoko, Agnes I, PMHNP-BC 11/08/2013, 2:07 PM

## 2013-11-09 NOTE — Progress Notes (Signed)
 .  Psychoeducational Group Note    Date: 11/09/2013 Time:  0930  Goal Setting Purpose of Group: To be able to set a goal that is measurable and that can be accomplished in one day Participation Level:  Active  Participation Quality:  Appropriate  Affect:  Appropriate  Cognitive:  Oriented  Insight:  Improving  Engagement in Group:  Engaged  Additional Comments:  Kennya Schwenn A 

## 2013-11-09 NOTE — Progress Notes (Signed)
Patient ID: Fernando [name], male   DOB: [birth year], 38 y.o.   MRN: [id number] D: Patient in room on approach. Pt mood and affect is angry and agitated. Pt demanded his medication and go to bed. Pt refuse to answer writer questions. When asked to remove lidocaine patch pt could not find it and does not remember if it fell off. Pt denies SI/HI/AVH and pain. Pt denies any needs or concerns.  Cooperative with assessment. No acute distressed noted at this time.   A: Met with pt 1:1. Medications administered as prescribed. Writer encouraged pt to discuss feelings. Pt encouraged to come to staff with any question or concerns.   R: Patient remains safe. He is complaint with medications and denies any adverse reaction. Continue current POC.

## 2013-11-09 NOTE — Progress Notes (Signed)
Adult Psychoeducational Group Note  Date:  11/09/2013 Time:  4:40 PM  Group Topic/Focus:  Healthy Communication:   The focus of this group is to discuss communication, barriers to communication, as well as healthy ways to communicate with others.  Participation Level:  Did Not Attend  Fernando MilesMercer, [name] N 11/09/2013, 4:40 PM

## 2013-11-09 NOTE — Progress Notes (Signed)
[facility] Group Notes:  (Nursing/MHT/Case Management/Adjunct)  Date:  11/09/2013  Time:  2100 Type of Therapy:  wrap up group  Participation Level:  Minimal  Participation Quality:  Attentive  Affect:  Irritable, Labile and Resistant  Cognitive:  Appropriate  Insight:  Lacking  Engagement in Group:  None  Modes of Intervention:  Clarification, Education and Support  Summary of Progress/Problems: Pt reporting nothing good happening today and he was grateful for nothing. Pt spoke only about being upset that he couldn't go down to the other hall just because he was tired of being on this side. Pt was reminded that he was waiting to hear about [facility], and asked him if he was still happy about going. Pt acknowledged that he was still looking forward to going but was more concerned with complaining.    Shelah LewandowskySquires, Wladyslawa Disbro Carol 11/09/2013, 10:10 PM

## 2013-11-09 NOTE — Progress Notes (Signed)
D) Pt. Agitated this morning. Has little understanding that when he attempts to touch someone that it is inappropriate. Was told by one of the staff members to please not touch her or anyone else and became upset and agitated. Refused to do his inventory today and tore it up. A) Pt provided with a 1:1 where he was able to hear that in many ways, it has nothing to do with him, rather that it has to do with the people and not with him. R) Pt was able to hear the message and has had a good day after that. Called Lavonda Jumborosha, but was able to not call as much today and follow the directions of the person he was talking with to only call at 4:00pm today.

## 2013-11-09 NOTE — [facility] Group Notes (Signed)
[facility] Group Notes:  (Clinical Social Work)  11/09/2013     10-11AM  Summary of Progress/Problems:   The main focus of today's process group was for the patient to identify ways in which they have in the past sabotaged their own recovery. Motivational Interviewing and a worksheet were utilized to help patients explore in depth the perceived benefits and costs of their substance use, as well as the potential benefits and costs of stopping.  The Stages of Change were explained using a handout, with an emphasis on making plans to deal with sabotaging behaviors proactivelky.  The patient expressed that the self-sabotaging behavior he uses is "arguing with staff."  When CSW asked about how he self-sabotages outside of the hospital setting, he stated that he does not argue, he fights.  The patient then fell asleep and remained asleep for the remainder of group.    Type of Therapy:  Group Therapy - Process   Participation Level:  Minimal  Participation Quality:  Drowsy  Affect:  Blunted  Cognitive:  Oriented  Insight:  Limited  Engagement in Therapy:  Limited  Modes of Intervention:  Education, Support and Processing, Motivational Interviewing  Fernando MantleMareida Grossman-Orr, LCSW 11/09/2013, 12:24 PM

## 2013-11-09 NOTE — Progress Notes (Signed)
Patient ID: Fernando [name], male   DOB: [birth year], 38 y.o.   MRN: [id number] [facility] MD Progress Note  11/09/2013 3:28 PM Fernando [name]  MRN:  [id number]  Subjective:  Asiel says he is doing well. He is actively participating in group, but he doesn't like hearing about people shooting up and doing drugs " I dont think alcohol is a drug".  These kind of drug talk gets my mood down. I want to hear about mental illness and how to stay sober". He states he spoke with a representative at [place] today he is willing to check on application status on Monday, and he is to call on Monday after 9. He is considering going to [facility] if he doesn't hear anything on Monday.  Reports sleeping well but he wakes up at 3am every day. He is anticipating going to bed later tonight, to watch the [facility].   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  Psychiatric Specialty Exam: Physical Exam   Review of Systems  Constitutional: Negative.   HENT: Negative.   Eyes: Negative.   Respiratory: Negative.   Cardiovascular: Negative.   Gastrointestinal: Negative.   Genitourinary: Negative.   Musculoskeletal: Negative.   Skin: Negative.   Neurological: Negative.   Endo/Heme/Allergies: Negative.   Psychiatric/Behavioral: Positive for substance abuse.    Blood pressure 147/98, pulse 71, temperature 97.6 F  (36.4 C), temperature source Oral, resp. rate 16, height 5\' 7"  (1.702 m), weight 87.091 kg (192 lb).Body mass index is 30.06 kg/(m^2).  General Appearance: Fairly Groomed  Patent attorney::  Fair  Speech:  Clear and Coherent  Volume:  fluctuates  Mood:  Anxious and worried, anticipating [facility] calling  Affect:  Appropriate  Thought Process:  Coherent and Goal Directed  Orientation:  Full (Time, Place, and Person)  Thought Content:  worries, concerns  Suicidal Thoughts:  No  Homicidal Thoughts:  No  Memory:  Immediate;   Fair Recent;   Fair Remote;   Fair  Judgement:  Fair  Insight:  Present and Shallow  Psychomotor Activity:  Restlessness  Concentration:  Fair  Recall:  [employer] of Knowledge:N/A  Language: Fair  Akathisia:  No  Handed:    AIMS (if indicated):     Assets:  Desire for Improvement  Sleep:  Number of Hours: 6.25   Musculoskeletal: Strength & Muscle Tone: within normal limits Gait & Station: normal Patient leans: N/A  Current Medications: Current Facility-Administered Medications  Medication Dose Route Frequency Provider Last Rate Last Dose  . acetaminophen (TYLENOL) tablet 650 mg  650 mg Oral Q4H PRN Shuvon Rankin, NP   650 mg at 11/04/13 1949  . alum & mag hydroxide-simeth (MAALOX/MYLANTA) 200-200-20 MG/5ML suspension 30 mL  30 mL Oral PRN Shuvon Rankin, NP   30 mL at 11/09/13 1506  . busPIRone (BUSPAR) tablet 15 mg  15 mg Oral TID Rachael Fee, MD   15 mg at 11/09/13 1309  . busPIRone (  BUSPAR) tablet 15 mg  15 mg Oral QHS Rachael FeeIrving A Lugo, MD   15 mg at 11/08/13 2213  . divalproex (DEPAKOTE ER) 24 hr tablet 500 mg  500 mg Oral TID AC Rachael FeeIrving A Lugo, MD   500 mg at 11/09/13 1309  . ibuprofen (ADVIL,MOTRIN) tablet 800 mg  800 mg Oral Q6H PRN Rachael FeeIrving A Lugo, MD   800 mg at 11/09/13 0437  . lidocaine (LIDODERM) 5 % 1 patch  1 patch Transdermal Q24H Court Joyharles E Kober, PA-C   1 patch at 11/09/13 [phone]  . magnesium hydroxide (MILK OF MAGNESIA) suspension 30 mL  30 mL Oral  Daily PRN Shuvon Rankin, NP      . metaxalone (SKELAXIN) tablet 800 mg  800 mg Oral QID Rachael FeeIrving A Lugo, MD   800 mg at 11/09/13 1309  . multivitamin with minerals tablet 1 tablet  1 tablet Oral Daily Shuvon Rankin, NP   1 tablet at 11/09/13 0904  . nicotine (NICODERM CQ - dosed in mg/24 hours) patch 21 mg  21 mg Transdermal Daily Shuvon Rankin, NP   21 mg at 11/09/13 0904  . OLANZapine zydis (ZYPREXA) disintegrating tablet 5 mg  5 mg Oral TID PRN Rachael FeeIrving A Lugo, MD   5 mg at 11/09/13 0907  . ondansetron (ZOFRAN) tablet 4 mg  4 mg Oral Q8H PRN Shuvon Rankin, NP      . thiamine (VITAMIN B-1) tablet 100 mg  100 mg Oral Daily Shuvon Rankin, NP   100 mg at 11/09/13 [id number]    Lab Results:  No results found for this or any previous visit (from the past 48 hour(s)).  Physical Findings: AIMS: Facial and Oral Movements Muscles of Facial Expression: None, normal Lips and Perioral Area: None, normal Jaw: None, normal Tongue: None, normal,Extremity Movements Upper (arms, wrists, hands, fingers): None, normal Lower (legs, knees, ankles, toes): None, normal, Trunk Movements Neck, shoulders, hips: None, normal, Overall Severity Severity of abnormal movements (highest score from questions above): None, normal Incapacitation due to abnormal movements: None, normal Patient's awareness of abnormal movements (rate only patient's report): No Awareness, Dental Status Current problems with teeth and/or dentures?: No Does patient usually wear dentures?: No  CIWA:  CIWA-Ar Total: 0 COWS:  COWS Total Score: 1  Treatment Plan Summary: Daily contact with patient to assess and evaluate symptoms and progress in treatment Medication management  Plan: Supportive approach/coping skills/relapse prevention Continue Depakote at 500 mg TID. Discharge plans in place  Medical Decision Making Problem Points:  Review of psycho-social stressors (1) Data Points:  Review of new medications or change in dosage (2)  I certify  that inpatient services furnished can reasonably be expected to improve the patient's condition.   Truman HaywardSTARKES, Mitul Hallowell S, PMHNP-BC 11/09/2013, 3:28 PM

## 2013-11-10 NOTE — Progress Notes (Signed)
Adult Psychoeducational Group Note  Date:  11/10/2013 Time:  3:52 PM  Group Topic/Focus:  Healthy Coping Skills  Participation Level:  Active  Participation Quality:  Appropriate and Attentive  Affect:  Appropriate  Cognitive:  Appropriate  Insight: Appropriate  Engagement in Group:  Engaged  Modes of Intervention:  Activity, Discussion and Socialization   Elijio MilesMercer, Shenell Rogalski N 11/10/2013, 3:52 PM

## 2013-11-10 NOTE — Progress Notes (Signed)
D) Pt had an ultraction with a male Pt this morning. Approached this writer early this morning and stated "I am having a problem, I need the medicine for my anxiety".  A) Pt was given Zyprexa for agitation. Praised for coming to staff to be helped with his anxiety. R) Pt went to his bed and rested. He then was able to come to the last part of group and partisapate

## 2013-11-10 NOTE — [facility] Group Notes (Signed)
[facility] Group Notes: (Clinical Social Work)   11/10/2013 10:00-11:00AM   Summary of Progress/Problems: The main focus of today's process group was to identify the patient's current support system and decide on other supports that can be put in place. The picture on workbook was used to discuss why additional supports are needed. An emphasis was placed on using counselor, doctor, therapy groups, 12-step groups, and problem-specific support groups to expand supports. There was also an extensive discussion about what constitutes a healthy support versus an unhealthy support. The patient expressed full comprehension of the concepts presented, and agreed that there is a need to add more supports. The patient stated the current supports in place are his pastor and his faith.  His family is not supportive.  He was adamant with the remainder of the group participants that faith of some kind is important in pursuing sobriety.  He was appropriate in the way in which he discussed this.  Type of Therapy: Process Group with Motivational Interviewing   Participation Level: Active   Participation Quality: Appropriate, Attentive  Affect: Blunted and Excited   Cognitive: Appropriate   Insight: Developing/Improving   Engagement in Therapy: Engaged   Modes of Intervention: Education, Teacher, English as a foreign languageupport and Processing, Activity   Pilgrim's PrideMareida Grossman-Orr, LCSW 12:53 PM

## 2013-11-10 NOTE — Progress Notes (Signed)
Patient ID: Fernando [name]   DOB: 07/20/1976, 38 y.o.   MRN: [id number] [facility]  11/10/2013 2:22 PM Fernando [name]  MRN:  [id number]  Subjective:  Fernando [name] says he is doing well. He is actively participating in group. " I dont think alcohol is a drug".  These kind of drug talk gets my mood down. I want to hear about mental illness and how to stay sober". He states he spoke with a representative at [facility] today he is willing to check on application status on Monday, and he is to call on Monday after 9. [facility] if he doesn't hear anything on Monday.  Reports sleeping well but he wakes up at 3am every day. [facility] [place] [facility].  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  Psychiatric Specialty Exam: Physical Exam   Review of Systems  Constitutional: Negative.   HENT: Negative.   Eyes: Negative.   Respiratory: Negative.   Cardiovascular: Negative.   Gastrointestinal: Negative.   Genitourinary: Negative.   Musculoskeletal: Negative.   Skin: Negative.   Neurological: Negative.   Endo/Heme/Allergies: Negative.   Psychiatric/Behavioral: Positive for substance abuse.    Blood pressure 125/84, pulse 79, temperature 97.6 F (36.4 C), temperature source Oral, resp. rate 18, height  5\' 7"  (1.702 m), weight 87.091 kg (192 lb).Body mass index is 30.06 kg/(m^2).  General Appearance: Fairly Groomed  Patent attorneyye Contact::  Fair  Speech:  Clear and Coherent  Volume:  fluctuates  Mood:  Anxious and worried, anticipating [facility] calling  Affect:  Appropriate  Thought Process:  Coherent and Goal Directed  Orientation:  Full (Time, Place, and Person)  Thought Content:  worries, concerns  Suicidal Thoughts:  No  Homicidal Thoughts:  No  Memory:  Immediate;   Fair Recent;   Fair Remote;   Fair  Judgement:  Fair  Insight:  Present and Shallow  Psychomotor Activity:  Restlessness  Concentration:  Fair  Recall:  FiservFair  Fund of Knowledge:N/A  Language: Fair  Akathisia:  No  Handed:    AIMS (if indicated):     Assets:  Desire for Improvement  Sleep:  Number of Hours: 4.5   Musculoskeletal: Strength & Muscle Tone: within normal limits Gait & Station: normal Patient leans: N/A  Current Medications: Current Facility-Administered Medications  Medication Dose Route Frequency Provider Last Rate Last Dose  . acetaminophen (TYLENOL) tablet 650 mg  650 mg Oral Q4H PRN Shuvon Rankin, NP   650 mg at 11/04/13 1949  . alum & mag hydroxide-simeth (MAALOX/MYLANTA) 200-200-20 MG/5ML suspension 30 mL  30 mL Oral PRN Shuvon Rankin, NP   30 mL at 11/10/13 0959  . busPIRone (BUSPAR) tablet 15 mg  15 mg Oral TID Rachael FeeIrving A Lugo, MD   15 mg at 11/10/13 1300  . busPIRone (BUSPAR) tablet 15 mg  15 mg Oral QHS Reymundo PollIrving A  Dub Mikes, MD   15 mg at 11/09/13 2319  . divalproex (DEPAKOTE ER) 24 hr tablet 500 mg  500 mg Oral TID AC Rachael Fee, MD   500 mg at 11/10/13 1302  . ibuprofen (ADVIL,MOTRIN) tablet 800 mg  800 mg Oral Q6H PRN Rachael Fee, MD   800 mg at 11/10/13 [phone]  . lidocaine (LIDODERM) 5 % 1 patch  1 patch Transdermal Q24H Court Joy, PA-C   1 patch at 11/10/13 [id number]  . magnesium hydroxide (MILK OF MAGNESIA) suspension 30 mL  30 mL Oral Daily PRN Shuvon Rankin, NP      . metaxalone (SKELAXIN)  tablet 800 mg  800 mg Oral QID Rachael Fee, MD   800 mg at 11/10/13 1300  . multivitamin with minerals tablet 1 tablet  1 tablet Oral Daily Shuvon Rankin, NP   1 tablet at 11/10/13 0747  . nicotine (NICODERM CQ - dosed in mg/24 hours) patch 21 mg  21 mg Transdermal Daily Shuvon Rankin, NP   21 mg at 11/10/13 5409  . OLANZapine zydis (ZYPREXA) disintegrating tablet 5 mg  5 mg Oral TID PRN Rachael Fee, MD   5 mg at 11/10/13 0751  . ondansetron (ZOFRAN) tablet 4 mg  4 mg Oral Q8H PRN Shuvon Rankin, NP      . thiamine (VITAMIN B-1) tablet 100 mg  100 mg Oral Daily Shuvon Rankin, NP   100 mg at 11/10/13 0747    Lab Results:  No results found for this or any previous visit (from the past 48 hour(s)).  Physical Findings: AIMS: Facial and Oral Movements Muscles of Facial Expression: None, normal Lips and Perioral Area: None, normal Jaw: None, normal Tongue: None, normal,Extremity Movements Upper (arms, wrists, hands, fingers): None, normal Lower (legs, knees, ankles, toes): None, normal, Trunk Movements Neck, shoulders, hips: None, normal, Overall Severity Severity of abnormal movements (highest score from questions above): None, normal Incapacitation due to abnormal movements: None, normal Patient's awareness of abnormal movements (rate only patient's report): No Awareness, Dental Status Current problems with teeth and/or dentures?: No Does patient usually wear dentures?: No  CIWA:  CIWA-Ar Total: 0 COWS:  COWS Total Score: 1  Treatment Plan Summary: Daily contact with patient to assess and evaluate symptoms and progress in treatment Medication management  Plan: Supportive approach/coping skills/relapse prevention Continue Depakote at 500 mg TID. Discharge plans in place  Medical Decision Making Problem Points:  Review of psycho-social stressors (1) Data Points:  Review of new medications or change in dosage (2)  I certify that inpatient services furnished can reasonably be  expected to improve the patient's condition.   Truman Hayward, FNP-BC 11/10/2013, 2:22 PM

## 2013-11-10 NOTE — Progress Notes (Signed)
Late entry on 11-10-2013 for 11-09-2013   Psychoeducational Group Note  Date: 11/10/2013 Time:  1015  Group Topic/Focus:  Identifying Needs:   The focus of this group is to help patients identify their personal needs that have been historically problematic and identify healthy behaviors to address their needs.  Participation Level:  Active  Participation Quality:  Appropriate  Affect:  Appropriate  Cognitive:  Oriented  Insight:  Improving  Engagement in Group:  Engaged  Additional Comments:  Participated fully in the group  Chayce Robbins A 

## 2013-11-10 NOTE — Progress Notes (Signed)
Pt continues to provoke peers into verbal altercations. Curses staff and other patients. Argumentative and verbally abusive. Disruptive to milieu and difficult to redirect. Pt still focused on whether app to [facility] will be accepted. Plans to follow up tomorrow. Pt medicated per orders. Zydis given at 2235 and on reassess, pt is calmer. States back pain and mobility is much improved and rates pain only a 4/10. Pt denies SI/HI/AVH. Will continue to monitor pt closely. Shivaay MarseillesFriedman, Milynn Quirion Eakes

## 2013-11-10 NOTE — Progress Notes (Signed)
Pt continues to be loud, intrusive and flirtatious at times. Argumentative with peers and staff. Cursing at male peer in the dayroom demanding that the patients watch the basketball game on tv. Several peers upset by patient's verbal aggression. Pt redirected. Medicated per orders. Refused zydis prn that was offered. Pt continues to display little insight into the inappropriateness of his behavior. Denies SI/HI/AVh and remains safe. Elmo MarseillesFriedman, Theotis Gerdeman Eakes

## 2013-11-10 NOTE — Progress Notes (Signed)
Psychoeducational Group Note  Date: 11/10/2013 Time:  0900  Group Topic/Focus:  Gratefulness:  The focus of this group is to help patients identify what two things they are most grateful for in their lives. What helps ground them and to center them on their work to their recovery.  Participation Level:  Active  Participation Quality:  Appropriate  Affect:  Appropriate  Cognitive:  Oriented  Insight:  Improving  Engagement in Group:  Engaged  Additional Comments:  Pt came in at the end, but engaged when he did.  Dione HousekeeperJudge, Ramia Sidney A

## 2013-11-10 NOTE — Progress Notes (Signed)
Psychoeducational Group Note  Date:  11/10/2013 Time:  1315 Group Topic/Focus:  Making Healthy Choices:   The focus of this group is to help patients identify negative/unhealthy choices they were using prior to admission and identify positive/healthier coping strategies to replace them upon discharge.  Participation Level:  Active  Participation Quality:  Appropriate  Affect:  Appropriate  Cognitive:  Oriented  Insight:  Improving  Engagement in Group:  Engaged  Additional Comments:    Fernando [name] A 11/10/2013 

## 2013-11-11 MED ORDER — DIVALPROEX SODIUM ER 500 MG PO TB24: 500.0000 mg | ORAL_TABLET | Freq: Three times a day (TID) | ORAL | Status: DC

## 2013-11-11 MED ORDER — IBUPROFEN 800 MG PO TABS
800.0000 mg | ORAL_TABLET | Freq: Three times a day (TID) | ORAL | Status: DC | PRN
  Filled 2013-11-11: qty 100

## 2013-11-11 MED ORDER — BUSPIRONE HCL 15 MG PO TABS
15.0000 mg | ORAL_TABLET | Freq: Four times a day (QID) | ORAL | Status: DC
  Administered 2013-11-11 – 2013-11-12 (×3): 15 mg via ORAL
  Filled 2013-11-11 (×7): qty 1

## 2013-11-11 MED ORDER — BUSPIRONE HCL 15 MG PO TABS: 15.0000 mg | ORAL_TABLET | Freq: Four times a day (QID) | ORAL | Status: DC

## 2013-11-11 MED ORDER — METAXALONE 800 MG PO TABS
800.0000 mg | ORAL_TABLET | Freq: Three times a day (TID) | ORAL | Status: DC | PRN
  Filled 2013-11-11: qty 100

## 2013-11-11 MED ORDER — BUSPIRONE HCL 15 MG PO TABS
15.0000 mg | ORAL_TABLET | Freq: Four times a day (QID) | ORAL | Status: DC
  Filled 2013-11-11 (×4): qty 240

## 2013-11-11 NOTE — [facility] Group Notes (Signed)
Adult Psychoeducational Group Note  Date:  11/11/2013 Time:  11:22 AM  Group Topic/Focus:  Dimensions of Wellness:   The focus of this group is to introduce the topic of wellness and discuss the role each dimension of wellness plays in total health.  Participation Level:  Active  Participation Quality:  Appropriate  Affect:  Appropriate  Cognitive:  Appropriate  Insight: Appropriate  Engagement in Group:  Engaged  Modes of Intervention:  Discussion, Education, Exploration and Support  Additional Comments:  Pt identified different self-care activities that will be implemented after discharge.  Rahil Passey P 11/11/2013, 11:22 AM

## 2013-11-11 NOTE — Progress Notes (Signed)
Patient in day room interacting with peers this evening. He endorsed feeling great!, denied SI/HI and denied withdawal. He seemed excited about going for treatment at [facility] tomorrow. Q 15 minute check continues as ordered to maintain safety.

## 2013-11-11 NOTE — Progress Notes (Signed)
Patient did attend the last half of the evening speaker AA meeting. Pt was sleeping for the first half.

## 2013-11-11 NOTE — [facility] Group Notes (Signed)
[facility] LCSW Group Therapy  11/11/2013 2:49 PM  Type of Therapy:  Group Therapy  Participation Level:  Active  Participation Quality:  Monopolizing and Redirectable  Affect:  Excited   Cognitive:  Alert and Oriented  Insight:  Improving  Engagement in Therapy:  Engaged  Modes of Intervention:  Confrontation, Discussion, Education, Exploration, Problem-solving, Rapport Building, Socialization and Support  Summary of Progress/Problems: Today's Topic: Overcoming Obstacles. Pt identified obstacles faced currently and processed barriers involved in overcoming these obstacles. Pt identified steps necessary for overcoming these obstacles and explored motivation (internal and external) for facing these difficulties head on. Pt further identified one area of concern in their lives and chose a skill of focus pulled from their "toolbox." Fernando [name] was attentive and engaged throughout today's therapy group. He shared that his biggest obstacle involves regulating negative emotions in order to be successful while in the [facility] program. Fernando [name] shows progress in the group setting and improving insight AEB his ability to identify ways to regulate emotions in a healthy way "I'm going to walk away to calm down when I feel my anxiety or anger getting out of control, do breathing exercises and keep a feelings journal."    Smart, Fernando [name] LCSWA  11/11/2013, 2:49 PM

## 2013-11-11 NOTE — Clinical Social Work Note (Signed)
Pt accepted into [facility] for tomorrow-CSW spoke to [place]issa and [place]Heather in intake today. Verified that pt will have 60 day med supply of Busbar and Depakote. Pt has train ticket in chart and must be d/ced by 11am (either friend will pick him up or he will be given bus pass to train station). Pt aware that he must [place] [facility] [place] [facility] [place]. [facility] will transport him to facility from train station. MD/PA/Staff aware that pt needs 60 day med supply and must be d/ced by 11am. CSW verified with Melissa that pt does not need followup with Mental Health in [place]. [facility] will provide pt with mental health treatment during his stay.  The Sherwin-WilliamsHeather Smart, LCSWA 11/11/2013 3:11 PM

## 2013-11-11 NOTE — Progress Notes (Signed)
Patient ID: Marvetta GibbonsLawrence Wax, male   DOB: 06/10/1976, 38 y.o.   MRN: [id number] He has been up and about and to groups interacting with peers and staff. Has been less loud intrusive today calmer. Self inventory: depression 0, hopelessness 0, no withdrawals, No SI thoughts.

## 2013-11-11 NOTE — [facility] Group Notes (Signed)
[facility]   11/11/2013 12:15 PM  Participation Quality:  Appropriate   Mood/Affect:  Appropriate  Depression Rating:  0  Anxiety Rating:  2  Thoughts of Suicide:  No Will you contract for safety?   NA  Current AVH:  No  Plan for Discharge/Comments:  Pt accepted into [facility] tomorrow with 60 day med supply. Pt needs train ticket Andreas Blower(Tashia notified and in process of ordering ticket for Tues AM). Contact person at [facility]: Ruthy DickHeather Daly (Intake Coordinator). CSW to contact [place]ather when train ticket purchased to make her aware of likely arrival time. They will pick pt up from train station. CSW assessing whether followup must be set up prior to pt arrival for med management in [place] area.   Transportation Means: train   Supports: limited family supports. Pt reports no other way to get to [place]/no money to pay for transport.   Smart, American FinancialHeather LCSWA

## 2013-11-11 NOTE — Progress Notes (Signed)
Adult Psychoeducational Group Note  Date:  11/11/2013 Time:  10:58 PM  Group Topic/Focus:  Goals Group:   The focus of this group is to help patients establish daily goals to achieve during treatment and discuss how the patient can incorporate goal setting into their daily lives to aide in recovery.  Participation Level:  Active  Participation Quality:  Appropriate  Affect:  Appropriate  Cognitive:  Appropriate  Insight: Appropriate  Engagement in Group:  Engaged  Modes of Intervention:  Discussion  Additional Comments:  Pt stated he is excited about his placement after DC tomorrow and looks forward to his alternate program.  Aldona LentoParker, Oluchi Pucci R 11/11/2013, 10:58 PM

## 2013-11-11 NOTE — Progress Notes (Signed)
[facility] MD Progress Note  11/11/2013 6:36 PM Fernando [name]  MRN:  [id number] Subjective:  Fernando [name] will be going to [facility] in the morning. He is excited because this will give him the structure and the time he needs to get his life back together. He has been worried about how he will get there but he is also using his coping skills not to get too upset about it.  Diagnosis:   DSM5: Schizophrenia Disorders:  None Obsessive-Compulsive Disorders:  none Trauma-Stressor Disorders:  none Substance/Addictive Disorders:  Alcohol Related Disorder - Severe (303.90) Cocaine dependence Depressive Disorders:  Major Depressive Disorder - Mild (296.21) Total Time spent with patient: 30 minutes  Axis I: Generalized Anxiety Disorder  ADL's:  Intact  Sleep: Fair  Appetite:  Fair  Suicidal Ideation:  Plan:  none Intent:  none Means:  none Homicidal Ideation:  Plan:  none Intent:  none Means:  none AEB (as evidenced by):  Psychiatric Specialty Exam: Physical Exam  Review of Systems  Constitutional: Negative.   HENT: Negative.   Eyes: Negative.   Respiratory: Negative.   Cardiovascular: Negative.   Gastrointestinal: Negative.   Genitourinary: Negative.   Musculoskeletal: Negative.   Skin: Negative.   Neurological: Negative.   Endo/Heme/Allergies: Negative.   Psychiatric/Behavioral: Positive for substance abuse.    Blood pressure 111/79, pulse 81, temperature 97.6 F (36.4 C), temperature source Oral, resp. rate 16, height 5\' 7"  (1.702 m), weight 87.091 kg (192 lb).Body mass index is 30.06 kg/(m^2).  General Appearance: Fairly Groomed  Patent attorney::  Fair  Speech:  Clear and Coherent  Volume:  Normal  Mood:  worried  Affect:  Appropriate  Thought Process:  Coherent and Goal Directed  Orientation:  Full (Time, Place, and Person)  Thought Content:  worries, concerns  Suicidal Thoughts:  No  Homicidal Thoughts:  No  Memory:  Immediate;   Fair Recent;   Fair Remote;   Fair  Judgement:   Fair  Insight:  Present  Psychomotor Activity:  Normal  Concentration:  Fair  Recall:  [employer] of Knowledge:Fair  Language: Fair  Akathisia:  No  Handed:    AIMS (if indicated):     Assets:  Desire for Improvement  Sleep:  Number of Hours: 5.25   Musculoskeletal: Strength & Muscle Tone: within normal limits Gait & Station: normal Patient leans: N/A  Current Medications: Current Facility-Administered Medications  Medication Dose Route Frequency Provider Last Rate Last Dose  . acetaminophen (TYLENOL) tablet 650 mg  650 mg Oral Q4H PRN Shuvon Rankin, NP   650 mg at 11/04/13 1949  . alum & mag hydroxide-simeth (MAALOX/MYLANTA) 200-200-20 MG/5ML suspension 30 mL  30 mL Oral PRN Shuvon Rankin, NP   30 mL at 11/10/13 0959  . busPIRone (BUSPAR) tablet 15 mg  15 mg Oral QID Fransisca Kaufmann, NP   15 mg at 11/11/13 1703  . divalproex (DEPAKOTE ER) 24 hr tablet 500 mg  500 mg Oral TID AC Rachael Fee, MD   500 mg at 11/11/13 1703  . ibuprofen (ADVIL,MOTRIN) tablet 800 mg  800 mg Oral Q6H PRN Rachael Fee, MD   800 mg at 11/10/13 1514  . lidocaine (LIDODERM) 5 % 1 patch  1 patch Transdermal Q24H Court Joy, PA-C   1 patch at 11/11/13 [phone]  . magnesium hydroxide (MILK OF MAGNESIA) suspension 30 mL  30 mL Oral Daily PRN Shuvon Rankin, NP      . metaxalone (SKELAXIN) tablet 800 mg  800 mg  Oral QID Rachael FeeIrving A Meggen Spaziani, MD   800 mg at 11/11/13 1703  . multivitamin with minerals tablet 1 tablet  1 tablet Oral Daily Shuvon Rankin, NP   1 tablet at 11/11/13 0852  . nicotine (NICODERM CQ - dosed in mg/24 hours) patch 21 mg  21 mg Transdermal Daily Shuvon Rankin, NP   21 mg at 11/11/13 0624  . OLANZapine zydis (ZYPREXA) disintegrating tablet 5 mg  5 mg Oral TID PRN Rachael FeeIrving A Vail Vuncannon, MD   5 mg at 11/11/13 1418  . ondansetron (ZOFRAN) tablet 4 mg  4 mg Oral Q8H PRN Shuvon Rankin, NP      . thiamine (VITAMIN B-1) tablet 100 mg  100 mg Oral Daily Shuvon Rankin, NP   100 mg at 11/11/13 [id number]    Lab Results: No  results found for this or any previous visit (from the past 48 hour(s)).  Physical Findings: AIMS: Facial and Oral Movements Muscles of Facial Expression: None, normal Lips and Perioral Area: None, normal Jaw: None, normal Tongue: None, normal,Extremity Movements Upper (arms, wrists, hands, fingers): None, normal Lower (legs, knees, ankles, toes): None, normal, Trunk Movements Neck, shoulders, hips: None, normal, Overall Severity Severity of abnormal movements (highest score from questions above): None, normal Incapacitation due to abnormal movements: None, normal Patient's awareness of abnormal movements (rate only patient's report): No Awareness, Dental Status Current problems with teeth and/or dentures?: No Does patient usually wear dentures?: No  CIWA:  CIWA-Ar Total: 0 COWS:  COWS Total Score: 1  Treatment Plan Summary: Daily contact with patient to assess and evaluate symptoms and progress in treatment Medication management  Plan: Supportive approach/coping skills/relapse prevention           [facility]  Medical Decision Making Problem Points:  Review of psycho-social stressors (1) Data Points:  Review of medication regiment & side effects (2)  I certify that inpatient services furnished can reasonably be expected to improve the patient's condition.   Rasheeda Mulvehill A 11/11/2013, 6:36 PM

## 2013-11-12 DIAGNOSIS — F1420 Cocaine dependence, uncomplicated: Secondary | ICD-10-CM

## 2013-11-12 DIAGNOSIS — F332 Major depressive disorder, recurrent severe without psychotic features: Secondary | ICD-10-CM

## 2013-11-12 DIAGNOSIS — F1020 Alcohol dependence, uncomplicated: Principal | ICD-10-CM

## 2013-11-12 MED ORDER — BUSPIRONE HCL 15 MG PO TABS: 15.0000 mg | ORAL_TABLET | Freq: Three times a day (TID) | ORAL | Status: DC

## 2013-11-12 MED ORDER — BUSPIRONE HCL 15 MG PO TABS
15.0000 mg | ORAL_TABLET | Freq: Three times a day (TID) | ORAL | Status: DC
  Filled 2013-11-12 (×3): qty 180

## 2013-11-12 NOTE — Progress Notes (Signed)
Patient came to the window requesting for "prn" but didn't say which one and what he needed prn for. Patient stated "I want my prn, I always take it every night for agitation". He appeared very angry, agitated, and rude to staff. Writer administered prn Zyprexa for agitation. Encouraged and supported patient. Q 15 minute check continues as ordered to maintain safety.

## 2013-11-12 NOTE — [facility] Suicide Risk Assessment (Signed)
Suicide Risk Assessment  Discharge Assessment     Demographic Factors:  Male  Total Time spent with patient: 45 minutes  Psychiatric Specialty Exam:     Blood pressure 133/81, pulse 84, temperature 98.5 F (36.9 C), temperature source Oral, resp. rate 20, height 5\' 7"  (1.702 m), weight 87.091 kg (192 lb).Body mass index is 30.06 kg/(m^2).  General Appearance: Fairly Groomed  Patent attorneyye Contact::  Fair  Speech:  Clear and Coherent  Volume:  Normal  Mood:  Euthymic  Affect:  Appropriate  Thought Process:  Coherent and Goal Directed  Orientation:  Full (Time, Place, and Person)  Thought Content:  relapse prevention plan  Suicidal Thoughts:  No  Homicidal Thoughts:  No  Memory:  Immediate;   Fair Recent;   Fair Remote;   Fair  Judgement:  Fair  Insight:  Present  Psychomotor Activity:  Restlessness  Concentration:  Fair  Recall:  FiservFair  Fund of Knowledge:NA  Language: Fair  Akathisia:  No  Handed:    AIMS (if indicated):     Assets:  Desire for Improvement  Sleep:  Number of Hours: 5.5    Musculoskeletal: Strength & Muscle Tone: within normal limits Gait & Station: normal Patient leans: N/A   Mental Status Per Nursing Assessment::   On Admission:  NA  Current Mental Status by Physician: In full contact with reality. There are no SI plans or intent. There are no active S/S of withdrawal. Mood euthymic. Excited to go to [facility]   Loss Factors: NA  Historical Factors: NA  Risk Reduction Factors:   NA  Continued Clinical Symptoms:  Depression:   Comorbid alcohol abuse/dependence Alcohol/Substance Abuse/Dependencies  Cognitive Features That Contribute To Risk:  Closed-mindedness Polarized thinking Thought constriction (tunnel vision)    Suicide Risk:  Minimal: No identifiable suicidal ideation.  Patients presenting with no risk factors but with morbid ruminations; may be classified as minimal risk based on the severity of the depressive symptoms  Discharge  Diagnoses:   AXIS I:  Alcohol Dependence, Cocaine Abuse, Mood Disorder NOS AXIS II:  Deferred AXIS III:   Past Medical History  Diagnosis Date  . Fx ankle   . Collar bone fracture   . Appendicitis   . Hepatitis C    AXIS IV:  other psychosocial or environmental problems AXIS V:  61-70 mild symptoms  Plan Of Care/Follow-up recommendations:  Activity:  as tolerated Diet:  regular Follow up [facility] Is patient on multiple antipsychotic therapies at discharge:  No   Has Patient had three or more failed trials of antipsychotic monotherapy by history:  No  Recommended Plan for Multiple Antipsychotic Therapies: NA    Joshoa Shawler A 11/12/2013, 10:18 AM

## 2013-11-12 NOTE — Discharge Summary (Signed)
Physician Discharge Summary Note  Patient:  Fernando [name] is an 38 y.o., male MRN:  [id number] DOB:  July 27, 1976 Patient phone:  [phone] (home)  Patient address:   [address] [place] [address],  Total Time spent with patient: Greater tahn 30 minutes  Date of Admission:  10/30/2013 Date of Discharge: 11/12/13  Reason for Admission:  Drug/alcohol detox  Discharge Diagnoses: Active Problems:   MDD (major depressive disorder), recurrent episode, severe   Alcohol dependence   Unspecified episodic mood disorder   GAD (generalized anxiety disorder)   Injury   Psychiatric Specialty Exam: Physical Exam  Constitutional: He is oriented to person, place, and time. He appears well-developed.  HENT:  Head: Normocephalic.  Eyes: Pupils are equal, round, and reactive to light.  Neck: Normal range of motion.  Cardiovascular: Normal rate.   Respiratory: Effort normal.  GI: Soft.  Genitourinary:  Denies any issues in this area  Musculoskeletal: Normal range of motion.  Neurological: He is alert and oriented to person, place, and time.  Skin: Skin is warm and dry.  Psychiatric: His speech is normal and behavior is normal. Thought content normal. His mood appears not anxious. His affect is not angry, not blunt, not labile and not inappropriate. Cognition and memory are normal. He does not exhibit a depressed mood.    Review of Systems  Constitutional: Negative.   HENT: Negative.   Eyes: Negative.   Respiratory: Negative.   Cardiovascular: Negative.   Gastrointestinal: Negative.   Genitourinary: Negative.   Musculoskeletal: Negative.   Skin: Negative.   Neurological: Negative.   Endo/Heme/Allergies: Negative.   Psychiatric/Behavioral: Positive for depression (Stable) and substance abuse. Negative for suicidal ideas and memory loss. Hallucinations: Cocaine dependence, Alcohol abuse. The patient is not nervous/anxious and does not have insomnia.     Blood pressure  133/81, pulse 84, temperature 98.5 F (36.9 C), temperature source Oral, resp. rate 20, height 5\' 7"  (1.702 m), weight 87.091 kg (192 lb).Body mass index is 30.06 kg/(m^2).  General Appearance: Casual and Fairly Groomed  Patent attorney::  Good  Speech:  Clear and Coherent  Volume:  Normal  Mood:  Stable  Affect:  Appropriate and Congruent  Thought Process:  Coherent and Goal Directed  Orientation:  Full (Time, Place, and Person)  Thought Content:  Denies any hallucinations  Suicidal Thoughts:  No  Homicidal Thoughts:  No  Memory:  Immediate;   Good Recent;   Good Remote;   Good  Judgement:  Good  Insight:  Present  Psychomotor Activity:  Normal  Concentration:  Good  Recall:  Good  Fund of Knowledge:Fair  Language: Good  Akathisia:  No  Handed:  Right  AIMS (if indicated):     Assets:  Desire for Improvement  Sleep:  Number of Hours: 5.5    Past Psychiatric History: Diagnosis: Alcohol related disorder - moderate, Cocaine dependence  Generalized anxiety disorder, Major depressive disorder, recurrent, severe   Hospitalizations: [facility] adult unit  Outpatient Care: NA  Substance Abuse Care: [facility]  Self-Mutilation: NA  Suicidal Attempts: NA  Violent Behaviors: NA   Musculoskeletal: Strength & Muscle Tone: within normal limits Gait & Station: normal Patient leans: N/A  DSM5: Schizophrenia Disorders:  NA Obsessive-Compulsive Disorders:  NA Trauma-Stressor Disorders:  NA Substance/Addictive Disorders:  Alcohol Related Disorder - Moderate (303.90), Cocaine dependence Depressive Disorders:  Generalized anxiety disorder, Major depressive disorder, recurrent, severe  Axis Diagnosis:   AXIS I:  Alcohol related disorder - moderate, Cocaine dependence,  Generalized anxiety  disorder, Major depressive disorder, recurrent, severe AXIS II:  Deferred AXIS III:   Past Medical History  Diagnosis Date  . Fx ankle   . Collar bone fracture   . Appendicitis   . Hepatitis C    AXIS IV:   Polysubstance abuse AXIS V:  63  Level of Care:  RTC  Hospital Course:  38 Y/O male with history of alcohol dependence who states he has had a rough time. States he is drinking as much as his money can buy. Liquor, 40's. Has used some cocaine. Has had multiple suicides in family. Last year his 38 Y/O cousin killed himself. Admits to thoughs of suicide himself, he laid on the road waiting for a car to kill him. He is currently homeless. He has financial problems as he has not been able to work since he hurt his ankle. States he is very upset as he was given Risperdal and he goggled it and it is for schizophrenia state since the Risperdal he has heard voices but then states he started hearing voices aferr he fell from a stand.   Fernando [name] was admitted with [facility]. He also reports increased alcohol consumption and cocaine dependence. He complained of suicidal ideations with plans to get himself killed. From his presented situation and history of substance dependence/abuse, he required detox as well referral for a long term treatment for substance dependence issues. He was then ordered and received Librium detox protocols. Jedi also required mood stabilization. He was ordered and received Depakote 500 mg three times daily for mood stabilization. He received Buspar 15 mg twice daily for anxiety.  Fernando [name] was also enrolled and participated in the group counseling sessions, AA/NA meetings being offered and held on this unit. He learned coping skills. He presented other medical issues that required treatment and or monitoring such as back pains. He tolerated his treatment without adverse effects. Fernando [name] has completed detox and mood stabilized. He is currently being discharged to start a long term substance abuse treatment at [facility] [place], [place]. Upon discharge, he adamantly denies any SIHI, AVH, delusional thoughts, paranoia and or withdrawal symptoms. He received 60  days worth of medications upon discharge. [facility] with all personal belongings in no distress. Transportation per BUS/TRAIN.  Consults:  psychiatry  Significant Diagnostic Studies:  labs: CBC with diff, CMP, UDS, toxicology tests, U/A  Discharge Vitals:   Blood pressure 133/81, pulse 84, temperature 98.5 F (36.9 C), temperature source Oral, resp. rate 20, height 5\' 7"  (1.702 m), weight 87.091 kg (192 lb). Body mass index is 30.06 kg/(m^2). Lab Results:   No results found for this or any previous visit (from the past 72 hour(s)).  Physical Findings: AIMS: Facial and Oral Movements Muscles of Facial Expression: None, normal Lips and Perioral Area: None, normal Jaw: None, normal Tongue: None, normal,Extremity Movements Upper (arms, wrists, hands, fingers): None, normal Lower (legs, knees, ankles, toes): None, normal, Trunk Movements Neck, shoulders, hips: None, normal, Overall Severity Severity of abnormal movements (highest score from questions above): None, normal Incapacitation due to abnormal movements: None, normal Patient's awareness of abnormal movements (rate only patient's report): No Awareness, Dental Status Current problems with teeth and/or dentures?: No Does patient usually wear dentures?: No  CIWA:  CIWA-Ar Total: 0 COWS:  COWS Total Score: 1  Psychiatric Specialty Exam: See Psychiatric Specialty Exam and Suicide Risk Assessment completed by Attending Physician prior to discharge.  Discharge destination:  Other:  [facility]  Is patient  on multiple antipsychotic therapies at discharge:  No   Has Patient had three or more failed trials of antipsychotic monotherapy by history:  No  Recommended Plan for Multiple Antipsychotic Therapies: NA    Medication List    STOP taking these medications       citalopram 20 MG tablet  Commonly known as:  CELEXA     risperiDONE 1 MG tablet  Commonly known as:  RISPERDAL      TAKE these medications     Indication    busPIRone 15 MG tablet  Commonly known as:  BUSPAR  Take 1 tablet (15 mg total) by mouth 3 (three) times daily. For anxiety   Indication:  Anxiety Disorder     divalproex 500 MG 24 hr tablet  Commonly known as:  DEPAKOTE ER  Take 1 tablet (500 mg total) by mouth 3 (three) times daily before meals.   Indication:  Improved mood stability       Follow-up Information   Follow up with [facility]. (Take train to [facility]. Once arrived, call [facility] intake [phone]. [facility] representative will pick you up and transport you to facility. Make sure that you have discharge paperwork and 60 day med supply. )    Contact information:   [address], [place] [address] Phone: [phone] Fax: [phone]     Follow-up recommendations: Activity:  As tolerated Diet: As recommended by your primary care doctor. Keep all scheduled follow-up appointments as recommended.     Comments:  Take all your medications as prescribed by your mental healthcare provider. Report any adverse effects and or reactions from your medicines to your outpatient provider promptly. Patient is instructed and cautioned to not engage in alcohol and or illegal drug use while on prescription medicines. In the event of worsening symptoms, patient is instructed to call the crisis hotline, 911 and or go to the nearest ED for appropriate evaluation and treatment of symptoms. Follow-up with your primary care provider for your other medical issues, concerns and or health care needs.   Total Discharge Time:  Greater than 30 minutes.  Signed: Sanjuana Kava, PMHNP-BC 11/12/2013, 3:34 PM Agree with assessment and plan Madie Reno A. Dub Mikes, M.D.

## 2013-11-12 NOTE — Progress Notes (Signed)
Patient ID: Marvetta GibbonsLawrence Slauson, male   DOB: 05/11/1976, 38 y.o.   MRN: [id number] He has been discharged and was provided tickets for transportation to [facility]  In [place]. Written and verbal direction were given to him on how to get to train station. He  Refused to lision to discharge instruction , saying" I have been told all this before and I can read". He denies thoughts Of SI and all his belongings were taken with him.

## 2013-11-12 NOTE — Progress Notes (Signed)
[facility] :  Will you be returning to the same living situation after discharge: No. [name].  At discharge, do you have transportation home?:Yes,  bus/train Do you have the ability to pay for your medications:Yes,  mental health  Release of information consent forms completed and submitted to Medical Records by CSW.  Patient to Follow up at: Follow-up Information   Follow up with [facility]. ([place]. Once arrived, call [facility] intake [phone]. [facility] representative will pick you up and transport you to facility. Make sure that you have discharge paperwork and 60 day med supply. )    Contact information:   [address] [place], [place] [address] Phone: [phone] Fax: [phone]      Patient denies SI/HI:   Yes,  during group/self report.    Safety Planning and Suicide Prevention discussed:  Yes,  pt refused family contact. SPE completed with pt. SPI pamphlet provided to pt and he was encouraged to share information with support network, ask questions, and talk about any concerns.   Smart, Fernando [name] LCSWA  11/12/2013, 9:13 AM

## 2013-11-15 NOTE — Progress Notes (Signed)
Patient Discharge Instructions:  After Visit Summary (AVS):   Faxed to:  11/15/13 Discharge Summary Note:   Faxed to:  11/15/13 Psychiatric Admission Assessment Note:   Faxed to:  11/15/13 Suicide Risk Assessment - Discharge Assessment:   Faxed to:  11/15/13 Faxed/Sent to the Next Level Care provider:  11/15/13 Faxed to [facility] @ [phone]  Jerelene ReddenSheena E Escondida, 11/15/2013, 3:49 PM

## 2014-02-19 ENCOUNTER — Emergency Department (HOSPITAL_COMMUNITY)
Admission: EM | Admit: 2014-02-19 | Discharge: 2014-02-20 | Disposition: A | Discharge status: Other Acute Inpatient Hospital | Payer: Self-pay | Attending: Emergency Medicine | Admitting: Emergency Medicine

## 2014-02-19 ENCOUNTER — Encounter (HOSPITAL_COMMUNITY): Payer: Self-pay | Admitting: Emergency Medicine

## 2014-02-19 DIAGNOSIS — F411 Generalized anxiety disorder: Secondary | ICD-10-CM | POA: Insufficient documentation

## 2014-02-19 DIAGNOSIS — F17200 Nicotine dependence, unspecified, uncomplicated: Secondary | ICD-10-CM | POA: Insufficient documentation

## 2014-02-19 DIAGNOSIS — Z8619 Personal history of other infectious and parasitic diseases: Secondary | ICD-10-CM | POA: Insufficient documentation

## 2014-02-19 DIAGNOSIS — Z8781 Personal history of (healed) traumatic fracture: Secondary | ICD-10-CM | POA: Insufficient documentation

## 2014-02-19 DIAGNOSIS — F1210 Cannabis abuse, uncomplicated: Secondary | ICD-10-CM | POA: Insufficient documentation

## 2014-02-19 DIAGNOSIS — Z8719 Personal history of other diseases of the digestive system: Secondary | ICD-10-CM | POA: Insufficient documentation

## 2014-02-19 DIAGNOSIS — F3289 Other specified depressive episodes: Secondary | ICD-10-CM | POA: Insufficient documentation

## 2014-02-19 DIAGNOSIS — F419 Anxiety disorder, unspecified: Secondary | ICD-10-CM

## 2014-02-19 DIAGNOSIS — IMO0002 Reserved for concepts with insufficient information to code with codable children: Secondary | ICD-10-CM | POA: Insufficient documentation

## 2014-02-19 DIAGNOSIS — F32A Depression, unspecified: Secondary | ICD-10-CM

## 2014-02-19 DIAGNOSIS — Z79899 Other long term (current) drug therapy: Secondary | ICD-10-CM | POA: Insufficient documentation

## 2014-02-19 DIAGNOSIS — F1020 Alcohol dependence, uncomplicated: Secondary | ICD-10-CM | POA: Insufficient documentation

## 2014-02-19 DIAGNOSIS — F329 Major depressive disorder, single episode, unspecified: Secondary | ICD-10-CM | POA: Insufficient documentation

## 2014-02-19 DIAGNOSIS — F1410 Cocaine abuse, uncomplicated: Secondary | ICD-10-CM | POA: Insufficient documentation

## 2014-02-19 LAB — COMPREHENSIVE METABOLIC PANEL
ALBUMIN: 4.7 g/dL (ref 3.5–5.2)
ALT: 223 U/L — AB (ref 0–53)
AST: 155 U/L — ABNORMAL HIGH (ref 0–37)
Alkaline Phosphatase: 62 U/L (ref 39–117)
BUN: 11 mg/dL (ref 6–23)
CO2: 20 mEq/L (ref 19–32)
Calcium: 9.8 mg/dL (ref 8.4–10.5)
Chloride: 100 mEq/L (ref 96–112)
Creatinine, Ser: 0.95 mg/dL (ref 0.50–1.35)
GFR calc Af Amer: >90 mL/min (ref >90)
GFR calc non Af Amer: >90 mL/min (ref >90)
GLUCOSE: 83 mg/dL (ref 70–99)
Potassium: 4 mEq/L (ref 3.7–5.3)
SODIUM: 141 meq/L (ref 137–147)
TOTAL PROTEIN: 9.1 g/dL — AB (ref 6.0–8.3)
Total Bilirubin: 0.4 mg/dL (ref 0.3–1.2)

## 2014-02-19 LAB — RAPID URINE DRUG SCREEN, HOSP PERFORMED
Amphetamines: NOT DETECTED
Barbiturates: NOT DETECTED
Benzodiazepines: NOT DETECTED
COCAINE: POSITIVE — AB
OPIATES: NOT DETECTED
TETRAHYDROCANNABINOL: POSITIVE — AB

## 2014-02-19 LAB — CBC
HCT: 46.7 % (ref 39.0–52.0)
Hemoglobin: 16.6 g/dL (ref 13.0–17.0)
MCH: 32.4 pg (ref 26.0–34.0)
MCHC: 35.5 g/dL (ref 30.0–36.0)
MCV: 91 fL (ref 78.0–100.0)
Platelets: 209 10*3/uL (ref 150–400)
RBC: 5.13 MIL/uL (ref 4.22–5.81)
RDW: 12.5 % (ref 11.5–15.5)
WBC: 12.2 10*3/uL — ABNORMAL HIGH (ref 4.0–10.5)

## 2014-02-19 LAB — SALICYLATE LEVEL: Salicylate Lvl: <2 mg/dL — ABNORMAL LOW (ref 2.8–20.0)

## 2014-02-19 LAB — ETHANOL: ALCOHOL ETHYL (B): 94 mg/dL — AB (ref 0–11)

## 2014-02-19 LAB — ACETAMINOPHEN LEVEL: Acetaminophen (Tylenol), Serum: <15 ug/mL (ref 10–30)

## 2014-02-19 LAB — I-STAT TROPONIN, ED: Troponin i, poc: 0 ng/mL (ref 0.00–0.08)

## 2014-02-19 MED ORDER — THIAMINE HCL 100 MG/ML IJ SOLN: 100.0000 mg | Freq: Every day | INTRAMUSCULAR | Status: DC

## 2014-02-19 MED ORDER — IBUPROFEN 400 MG PO TABS
600.0000 mg | ORAL_TABLET | Freq: Three times a day (TID) | ORAL | Status: DC | PRN
  Administered 2014-02-20: 600 mg via ORAL
  Filled 2014-02-19 (×2): qty 1

## 2014-02-19 MED ORDER — NICOTINE 21 MG/24HR TD PT24
21.0000 mg | MEDICATED_PATCH | Freq: Every day | TRANSDERMAL | Status: DC
  Administered 2014-02-19 – 2014-02-20 (×2): 21 mg via TRANSDERMAL
  Filled 2014-02-19 (×2): qty 1

## 2014-02-19 MED ORDER — VITAMIN B-1 100 MG PO TABS
100.0000 mg | ORAL_TABLET | Freq: Every day | ORAL | Status: DC
  Administered 2014-02-19 – 2014-02-20 (×2): 100 mg via ORAL
  Filled 2014-02-19 (×2): qty 1

## 2014-02-19 MED ORDER — ALUM & MAG HYDROXIDE-SIMETH 200-200-20 MG/5ML PO SUSP
30.0000 mL | ORAL | Status: DC | PRN
  Filled 2014-02-19: qty 30

## 2014-02-19 MED ORDER — ZOLPIDEM TARTRATE 5 MG PO TABS: 5.0000 mg | ORAL_TABLET | Freq: Every evening | ORAL | Status: DC | PRN

## 2014-02-19 MED ORDER — LORAZEPAM 1 MG PO TABS: 0.0000 mg | ORAL_TABLET | Freq: Two times a day (BID) | ORAL | Status: DC

## 2014-02-19 MED ORDER — ONDANSETRON HCL 4 MG PO TABS
4.0000 mg | ORAL_TABLET | Freq: Three times a day (TID) | ORAL | Status: DC | PRN
  Administered 2014-02-20: 4 mg via ORAL
  Filled 2014-02-19: qty 1

## 2014-02-19 MED ORDER — LORAZEPAM 1 MG PO TABS
0.0000 mg | ORAL_TABLET | Freq: Four times a day (QID) | ORAL | Status: DC
  Administered 2014-02-19 – 2014-02-20 (×2): 1 mg via ORAL
  Filled 2014-02-19 (×2): qty 1

## 2014-02-19 MED ORDER — LORAZEPAM 1 MG PO TABS
2.0000 mg | ORAL_TABLET | Freq: Once | ORAL | Status: AC
  Administered 2014-02-19: 2 mg via ORAL
  Filled 2014-02-19: qty 2

## 2014-02-19 NOTE — ED Notes (Signed)
EDPA states patient now denies SI to her, states to cancel sitter at bedside.

## 2014-02-19 NOTE — ED Notes (Signed)
Tele-psych at bedside.

## 2014-02-19 NOTE — ED Provider Notes (Signed)
CSN: [id number]     Arrival date & time 02/19/14  1019 History   First MD Initiated Contact with Patient 02/19/14 1204     Chief Complaint  Patient presents with  . Depression  . Anxiety     (Consider location/radiation/quality/duration/timing/severity/associated sxs/prior Treatment) HPI  Fernando [name] is a 38 y.o. male presented for evaluation of anxiety, depression, suicidal ideation. Patient has had several psychiatric hospitalizations, should be taking Celexa and Depakote but self DC'd several months ago. States that he's been drinking alcohol to deal with his anxiety and depression. He has had 2x "four locos" this morning before coming to the ED. He states that he drinks daily, gets tremors when he doesn't drink. States that he has never had hallucinations from withdrawal. States that he's had a seizure in the past he doesn't know if it's alcohol withdrawal related. Patient states he has one prior suicide attempt he lay down in front of traffic, state that he has 3 cousins the committed suicide. Does not have a plan today. Does not have access to firearms. He denies homicidal ideation, audio or visual hallucinations. He endorses heavy crack cocaine use yesterday. States that he has chest pain when he anxiety comes on, no prior cardiac history, denies shortness of breath, nausea vomiting, change in bowel or bladder habits.  Past Medical History  Diagnosis Date  . Fx ankle   . Collar bone fracture   . Appendicitis   . Hepatitis C    Past Surgical History  Procedure Laterality Date  . Appendectomy     History reviewed. No pertinent family history. History  Substance Use Topics  . Smoking status: Current Every Day Smoker -- 1.00 packs/day for 19 years    Types: Cigarettes  . Smokeless tobacco: Never Used  . Alcohol Use: 4.2 oz/week    7 Cans of beer per week    Review of Systems  10 systems reviewed and found to be negative, except as noted in the HPI.   Allergies  Review  of patient's allergies indicates no known allergies.  Home Medications   Prior to Admission medications   Medication Sig Start Date End Date Taking? Authorizing Provider  busPIRone (BUSPAR) 15 MG tablet Take 1 tablet (15 mg total) by mouth 3 (three) times daily. For anxiety 11/12/13   Sanjuana KavaAgnes I Nwoko, NP  divalproex (DEPAKOTE ER) 500 MG 24 hr tablet Take 1 tablet (500 mg total) by mouth 3 (three) times daily before meals. 11/11/13   Fransisca KaufmannLaura Davis, NP   BP 129/67  Pulse 85  Temp(Src) 98.6 F (37 C) (Oral)  Resp 18  SpO2 97% Physical Exam  Nursing note and vitals reviewed. Constitutional: He is oriented to person, place, and time. He appears well-developed and well-nourished.  HENT:  Head: Normocephalic.  Mouth/Throat: Oropharynx is clear and moist.  Eyes: Conjunctivae and EOM are normal. Pupils are equal, round, and reactive to light.  Neck: Normal range of motion.  Cardiovascular: Normal rate, regular rhythm and intact distal pulses.   Pulmonary/Chest: Effort normal. No stridor. No respiratory distress. He has no rales. He exhibits no tenderness.  Abdominal: Soft. Bowel sounds are normal. He exhibits no distension and no mass. There is no tenderness. There is no rebound and no guarding.  Musculoskeletal: Normal range of motion.  Neurological: He is alert and oriented to person, place, and time.  Psychiatric: His mood appears anxious. He is agitated. He is not hyperactive and not actively hallucinating. He exhibits a depressed mood. He expresses no  suicidal ideation. He expresses no suicidal plans.  Tearful, agitated    ED Course  Procedures (including critical care time) Labs Review Labs Reviewed  CBC - Abnormal; Notable for the following:    WBC 12.2 (*)    All other components within normal limits  COMPREHENSIVE METABOLIC PANEL - Abnormal; Notable for the following:    Total Protein 9.1 (*)    AST 155 (*)    ALT 223 (*)    All other components within normal limits  ETHANOL -  Abnormal; Notable for the following:    Alcohol, Ethyl (B) 94 (*)    All other components within normal limits  SALICYLATE LEVEL - Abnormal; Notable for the following:    Salicylate Lvl <2.0 (*)    All other components within normal limits  ACETAMINOPHEN LEVEL  URINE RAPID DRUG SCREEN (HOSP PERFORMED)  I-STAT TROPOININ, ED    Imaging Review No results found.   EKG Interpretation   Date/Time:  Wednesday February 19 2014 13:17:50 EDT Ventricular Rate:  73 PR Interval:  195 QRS Duration: 88 QT Interval:  398 QTC Calculation: 439 R Axis:   76 Text Interpretation:  Sinus rhythm ST elev, probable normal early repol  pattern No significant change since last tracing Confirmed by GOLDSTON   MD, SCOTT ([id number]) on 02/19/2014 1:20:49 PM      1:19 PM patient denies any active suicidal ideation, contracts for safety, repeatedly states that he is not suicidal. Comptrolleritter DC'd  MDM   Final diagnoses:  Alcohol dependence  Depression  Anxiety    Filed Vitals:   02/19/14 1037 02/19/14 1208  BP: 129/76 129/67  Pulse: 99 85  Temp: 98.6 F (37 C)   TempSrc: Oral   Resp: 18 18  SpO2: 100% 97%    Medications  ibuprofen (ADVIL,MOTRIN) tablet 600 mg (not administered)  zolpidem (AMBIEN) tablet 5 mg (not administered)  nicotine (NICODERM CQ - dosed in mg/24 hours) patch 21 mg (not administered)  ondansetron (ZOFRAN) tablet 4 mg (not administered)  alum & mag hydroxide-simeth (MAALOX/MYLANTA) 200-200-20 MG/5ML suspension 30 mL (not administered)  LORazepam (ATIVAN) tablet 0-4 mg (not administered)    Followed by  LORazepam (ATIVAN) tablet 0-4 mg (not administered)  thiamine (VITAMIN B-1) tablet 100 mg (not administered)    Or  thiamine (B-1) injection 100 mg (not administered)  LORazepam (ATIVAN) tablet 2 mg (2 mg Oral Given 02/19/14 1305)    Fernando [name] is a 38 y.o. male presenting with anxiety and depression, initially patient states he is suicidal, then retracts. Patient is tearful,  intoxicated, also requests detox from alcohol.  Alcohol level 94, transaminitis consistent with alcohol abuse. Abdominal exam is benign, and a mild leukocytosis. EKG with no acute changes, troponin negative. Strangely, no cocaine is disconnected and urine drug screen. Patient states that he used crack cocaine over the weekend.  Patient is medically cleared for psychiatric evaluation will be transferred to the psych ED. TTS consulted, CIWA, home meds and psych standard holding orders placed.     Wynetta Emeryicole Pisciotta, PA-C 02/19/14 1327

## 2014-02-19 NOTE — ED Notes (Signed)
[facility] to [facility].  Call unable to go through.  IT notified.

## 2014-02-19 NOTE — ED Notes (Signed)
Pt arrives via POV from home reports increased depression and anxiety from over the weekend. Reports he was supposed to attend his son's graduation but couldn't go because he gets too anxius in large groups of people. Pt reports drinking alcohol to help cope with his problems. Pt reports he quit taking his meds a few months ago. States he needs help but no one understands him. Pt tearful. Tired of dealing with his problems. Denies SI/HI. States he wants to live but needs help.
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
## 2014-02-20 ENCOUNTER — Encounter (HOSPITAL_COMMUNITY): Payer: Self-pay | Admitting: *Deleted

## 2014-02-20 ENCOUNTER — Observation Stay (HOSPITAL_COMMUNITY)
Admission: EM | Admit: 2014-02-20 | Discharge: 2014-02-21 | Discharge status: Against Medical Advice | Payer: Self-pay | Source: Intra-hospital | Attending: Psychiatry | Admitting: Psychiatry

## 2014-02-20 DIAGNOSIS — F39 Unspecified mood [affective] disorder: Secondary | ICD-10-CM | POA: Insufficient documentation

## 2014-02-20 DIAGNOSIS — F1010 Alcohol abuse, uncomplicated: Principal | ICD-10-CM | POA: Insufficient documentation

## 2014-02-20 DIAGNOSIS — Z5900 Homelessness unspecified: Secondary | ICD-10-CM | POA: Insufficient documentation

## 2014-02-20 DIAGNOSIS — F14282 Cocaine dependence with cocaine-induced sleep disorder: Secondary | ICD-10-CM

## 2014-02-20 DIAGNOSIS — F1910 Other psychoactive substance abuse, uncomplicated: Secondary | ICD-10-CM | POA: Diagnosis present

## 2014-02-20 DIAGNOSIS — T1490XA Injury, unspecified, initial encounter: Secondary | ICD-10-CM

## 2014-02-20 DIAGNOSIS — F10280 Alcohol dependence with alcohol-induced anxiety disorder: Secondary | ICD-10-CM

## 2014-02-20 DIAGNOSIS — F411 Generalized anxiety disorder: Secondary | ICD-10-CM | POA: Insufficient documentation

## 2014-02-20 MED ORDER — LORAZEPAM 1 MG PO TABS: 0.0000 mg | ORAL_TABLET | Freq: Four times a day (QID) | ORAL | Status: DC

## 2014-02-20 MED ORDER — ADULT MULTIVITAMIN W/MINERALS CH
1.0000 | ORAL_TABLET | Freq: Every day | ORAL | Status: DC
  Administered 2014-02-21: 1 via ORAL
  Filled 2014-02-20 (×3): qty 1

## 2014-02-20 MED ORDER — LORAZEPAM 2 MG/ML IJ SOLN: 1.0000 mg | Freq: Four times a day (QID) | INTRAMUSCULAR | Status: DC | PRN

## 2014-02-20 MED ORDER — VITAMIN B-1 100 MG PO TABS
100.0000 mg | ORAL_TABLET | Freq: Every day | ORAL | Status: DC
  Administered 2014-02-21: 100 mg via ORAL
  Filled 2014-02-20 (×3): qty 1

## 2014-02-20 MED ORDER — THIAMINE HCL 100 MG/ML IJ SOLN
100.0000 mg | Freq: Every day | INTRAMUSCULAR | Status: DC
Start: 2014-02-21 — End: 2014-02-21

## 2014-02-20 MED ORDER — BUSPIRONE HCL 15 MG PO TABS
15.0000 mg | ORAL_TABLET | Freq: Three times a day (TID) | ORAL | Status: DC
  Administered 2014-02-21: 15 mg via ORAL
  Filled 2014-02-20 (×4): qty 1

## 2014-02-20 MED ORDER — ACETAMINOPHEN 325 MG PO TABS
650.0000 mg | ORAL_TABLET | Freq: Four times a day (QID) | ORAL | Status: DC | PRN
Start: 2014-02-20 — End: 2014-02-21

## 2014-02-20 MED ORDER — DIVALPROEX SODIUM ER 500 MG PO TB24
500.0000 mg | ORAL_TABLET | Freq: Three times a day (TID) | ORAL | Status: DC
  Administered 2014-02-20 (×2): 500 mg via ORAL
  Filled 2014-02-20 (×4): qty 1

## 2014-02-20 MED ORDER — ALUM & MAG HYDROXIDE-SIMETH 200-200-20 MG/5ML PO SUSP: 30.0000 mL | ORAL | Status: DC | PRN

## 2014-02-20 MED ORDER — MAGNESIUM HYDROXIDE 400 MG/5ML PO SUSP: 30.0000 mL | Freq: Every day | ORAL | Status: DC | PRN

## 2014-02-20 MED ORDER — DIVALPROEX SODIUM ER 500 MG PO TB24
500.0000 mg | ORAL_TABLET | Freq: Three times a day (TID) | ORAL | Status: DC
  Administered 2014-02-21: 500 mg via ORAL
  Filled 2014-02-20 (×5): qty 1

## 2014-02-20 MED ORDER — FOLIC ACID 1 MG PO TABS
1.0000 mg | ORAL_TABLET | Freq: Every day | ORAL | Status: DC
  Administered 2014-02-21: 1 mg via ORAL
  Filled 2014-02-20 (×3): qty 1

## 2014-02-20 MED ORDER — LORAZEPAM 1 MG PO TABS: 0.0000 mg | ORAL_TABLET | Freq: Two times a day (BID) | ORAL | Status: DC

## 2014-02-20 MED ORDER — BUSPIRONE HCL 10 MG PO TABS
15.0000 mg | ORAL_TABLET | Freq: Three times a day (TID) | ORAL | Status: DC
  Administered 2014-02-20: 15 mg via ORAL
  Filled 2014-02-20: qty 2

## 2014-02-20 MED ORDER — LORAZEPAM 1 MG PO TABS
1.0000 mg | ORAL_TABLET | Freq: Four times a day (QID) | ORAL | Status: DC | PRN
  Administered 2014-02-20: 1 mg via ORAL
  Filled 2014-02-20: qty 1

## 2014-02-20 NOTE — Progress Notes (Signed)
Patient ID: Fernando GibbonsLawrence Schools, male   DOB: 11/21/1975, 38 y.o.   MRN: [id number] Pt alert and oriented. C/o depression and hopelessness. Denies SI/HI, -A/[facility] Verbally contracts for safety. Denies pain or discomfort at present. Will continue to monitor and evaluate for stabilization.

## 2014-02-20 NOTE — Progress Notes (Signed)
[facility] INPATIENT:  Family/Significant Other Suicide Prevention Education  Suicide Prevention Education:  Patient Refusal for Family/Significant Other Suicide Prevention Education: The patient Fernando [name] has refused to provide written consent for family/significant other to be provided Family/Significant Other Suicide Prevention Education during admission and/or prior to discharge.  Physician notified.  Loren RacerMaggio, Kimberly J 02/20/2014, 6:48 PM

## 2014-02-20 NOTE — Progress Notes (Signed)
Admission note. Patient admitted to unit stating he was depressed and crying all the time. Stated he has SI daily with no plan. Denies HI and AVH. Oriented to unit. Nutrition offered. Education provided regarding safety and falls. Safety checks started every 15 minutes.

## 2014-02-20 NOTE — [facility] Counselor (Addendum)
Pt accepted to OBS bed 6. Writer spoke w/ RN Irving BurtonEmily who has [facility] pt at 5 pm.  Evette Cristalaroline Paige McLean, [place] Assessment Counselor

## 2014-02-20 NOTE — ED Provider Notes (Signed)
[facility] Dr. Lucianne MussKumar is the attending.   Fernando ArgyleForrest S Harrison, MD 02/20/14 2102

## 2014-02-21 DIAGNOSIS — F1994 Other psychoactive substance use, unspecified with psychoactive substance-induced mood disorder: Secondary | ICD-10-CM

## 2014-02-21 DIAGNOSIS — F1920 Other psychoactive substance dependence, uncomplicated: Secondary | ICD-10-CM

## 2014-02-21 NOTE — ED Provider Notes (Signed)
Medical screening examination/treatment/procedure(s) were performed by non-physician practitioner and as supervising physician I was immediately available for consultation/collaboration.   EKG Interpretation   Date/Time:  Wednesday February 19 2014 13:17:50 EDT Ventricular Rate:  73 PR Interval:  195 QRS Duration: 88 QT Interval:  398 QTC Calculation: 439 R Axis:   76 Text Interpretation:  Sinus rhythm ST elev, probable normal early repol  pattern No significant change since last tracing Confirmed by GOLDSTON   MD, SCOTT ([id number]) on 02/19/2014 1:20:49 PM        Audree CamelScott T Goldston, MD 02/21/14 [phone]

## 2014-02-21 NOTE — Plan of Care (Signed)
[facility] Observation Crisis Plan  Reason for Crisis Plan:  Chronic Mental Illness/Medical Illness   Plan of Care:  Referral for Telepsychiatry/Psychiatric Consult  Family Support:      Current Living Environment:  Living Arrangements: Non-relatives/Friends  Insurance:   Hospital Account   Name Acct ID Class Status Primary Coverage   Marvetta GibbonsMcRae, TRUE [id number] BEHAVIORAL HEALTH OBSERVATION Open None        Guarantor Account (for Hospital Account [id number])   Name Relation to Pt Service Area Active? Acct Type   Marvetta GibbonsMcRae, Brnadon Self [facility] Yes Behavioral [facility]   Address Phone       [address] [place], [place] [address] [phone](H)          Coverage Information (for Hospital Account [id number])   Not on file      Legal Guardian:     Primary Care Provider:  No primary provider on file.  Current Outpatient Providers:  unknown  Psychiatrist:     Counselor/Therapist:     Compliant with Medications:  No  Additional Information:   Loren RacerMaggio, Naziah Weckerly J 6/19/20158:36 AM

## 2014-02-21 NOTE — Progress Notes (Signed)
Patient discharged AMA. Extremely agitated and verbally aggressive towards staff. Patient was escorted to locker and given belongings before discharge to hospital lobby with bus passes.Refused to sign for returned belongings.

## 2014-02-22 NOTE — [facility] Suicide Risk Assessment (Cosign Needed)
Suicide Risk Assessment  Discharge Assessment     Demographic Factors:  Low socioeconomic status and substance abuse  Total Time spent with patient: 20 minutes  Psychiatric Specialty Exam:     Blood pressure 111/72, pulse 65, temperature 98 F (36.7 C), temperature source Oral, resp. rate 16, height 5\' 8"  (1.727 m), weight 88.451 kg (195 lb), SpO2 100.00%.Body mass index is 29.66 kg/(m^2).  General Appearance: See D/C EXAM  Eye Contact::SEE D/C  Speech:  SEE D/C  Volume:  SEE D/C  Mood SEE D/C  Affect:  SEE D/C  Thought Process:  SEE D/C  Orientation:  SEE D/C  Thought Content:  SEE D/C  Suicidal Thoughts:  SEE D/C  Homicidal Thoughts:  SEE D/C  Memory:  SEE D/C  Judgement:  SEE D/C  Insight:  SEE D/C  Psychomotor Activity:  SEE D/C  Concentration:  SEE D/C  Recall:  SEE D/C  Fund of Knowledge:SEE D/C  Language: SEE D/C  Akathisia:  SEE D/C  Handed:  SEE D/C  AIMS (if indicated):  SEE D/C  Assets:  SEE D/C  Sleep:  SEE D/C    Musculoskeletal: Strength & Muscle Tone: within normal limits Gait & Station: normal Patient leans: N/A   Mental Status Per Nursing Assessment::   On Admission:  DEPRESSED WITHOUT SI/HI  Current Mental Status by Physician: AGREE WITH nURSING  Loss Factors: Chronic ongoing substance abuse unemployment and homelessnes  Historical Factors: Family history of mental illness or substance abuse  Risk Reduction Factors:   Seeking respite  Continued Clinical Symptoms:  Severe Anxiety and/or Agitation Alcohol/Substance Abuse/Dependencies  Cognitive Features That Contribute To Risk:  Closed-mindedness    Suicide Risk:  Minimal: No identifiable suicidal ideation.  Patients presenting with no risk factors but with morbid ruminations; may be classified as minimal risk based on the severity of the depressive symptoms  Discharge Diagnoses:   AXIS I:  Polysubstance dependency;SIMD AXIS II:  Personality Disorder NOS AXIS III:   Past  Medical History  Diagnosis Date  . Fx ankle   . Collar bone fracture   . Appendicitis   . Hepatitis C    AXIS IV:  economic problems, occupational problems and problems with primary support group AXIS V:  41-50 serious symptoms  Plan Of Care/Follow-up recommendations:  Other:  Pt refused   Is patient on multiple antipsychotic therapies at discharge:  No   Has Patient had three or more failed trials of antipsychotic monotherapy by history:  No  Recommended Plan for Multiple Antipsychotic Therapies: NA    KOBER, CHARLES E 02/22/2014, 11:46 PM
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
## 2014-02-23 ENCOUNTER — Emergency Department (HOSPITAL_COMMUNITY)
Admission: EM | Admit: 2014-02-23 | Discharge: 2014-02-24 | Disposition: A | Discharge status: Home / Self Care | Payer: Self-pay | Attending: Emergency Medicine | Admitting: Emergency Medicine

## 2014-02-23 ENCOUNTER — Encounter (HOSPITAL_COMMUNITY): Payer: Self-pay | Admitting: Emergency Medicine

## 2014-02-23 DIAGNOSIS — Z8719 Personal history of other diseases of the digestive system: Secondary | ICD-10-CM | POA: Insufficient documentation

## 2014-02-23 DIAGNOSIS — F1994 Other psychoactive substance use, unspecified with psychoactive substance-induced mood disorder: Secondary | ICD-10-CM | POA: Diagnosis present

## 2014-02-23 DIAGNOSIS — F3289 Other specified depressive episodes: Secondary | ICD-10-CM

## 2014-02-23 DIAGNOSIS — Z8781 Personal history of (healed) traumatic fracture: Secondary | ICD-10-CM | POA: Insufficient documentation

## 2014-02-23 DIAGNOSIS — F10220 Alcohol dependence with intoxication, uncomplicated: Secondary | ICD-10-CM

## 2014-02-23 DIAGNOSIS — F1910 Other psychoactive substance abuse, uncomplicated: Secondary | ICD-10-CM | POA: Diagnosis present

## 2014-02-23 DIAGNOSIS — F17200 Nicotine dependence, unspecified, uncomplicated: Secondary | ICD-10-CM | POA: Insufficient documentation

## 2014-02-23 DIAGNOSIS — R443 Hallucinations, unspecified: Secondary | ICD-10-CM | POA: Insufficient documentation

## 2014-02-23 DIAGNOSIS — F1210 Cannabis abuse, uncomplicated: Secondary | ICD-10-CM | POA: Insufficient documentation

## 2014-02-23 DIAGNOSIS — Z5900 Homelessness unspecified: Secondary | ICD-10-CM

## 2014-02-23 DIAGNOSIS — R45851 Suicidal ideations: Secondary | ICD-10-CM

## 2014-02-23 DIAGNOSIS — F1410 Cocaine abuse, uncomplicated: Secondary | ICD-10-CM | POA: Insufficient documentation

## 2014-02-23 DIAGNOSIS — F1024 Alcohol dependence with alcohol-induced mood disorder: Secondary | ICD-10-CM

## 2014-02-23 DIAGNOSIS — F1110 Opioid abuse, uncomplicated: Secondary | ICD-10-CM | POA: Insufficient documentation

## 2014-02-23 DIAGNOSIS — F332 Major depressive disorder, recurrent severe without psychotic features: Secondary | ICD-10-CM | POA: Diagnosis present

## 2014-02-23 DIAGNOSIS — F1424 Cocaine dependence with cocaine-induced mood disorder: Secondary | ICD-10-CM

## 2014-02-23 DIAGNOSIS — F1420 Cocaine dependence, uncomplicated: Secondary | ICD-10-CM | POA: Diagnosis present

## 2014-02-23 DIAGNOSIS — F411 Generalized anxiety disorder: Secondary | ICD-10-CM | POA: Diagnosis present

## 2014-02-23 DIAGNOSIS — F1020 Alcohol dependence, uncomplicated: Secondary | ICD-10-CM

## 2014-02-23 DIAGNOSIS — R440 Auditory hallucinations: Secondary | ICD-10-CM

## 2014-02-23 DIAGNOSIS — Z8619 Personal history of other infectious and parasitic diseases: Secondary | ICD-10-CM | POA: Insufficient documentation

## 2014-02-23 DIAGNOSIS — F10920 Alcohol use, unspecified with intoxication, uncomplicated: Secondary | ICD-10-CM

## 2014-02-23 DIAGNOSIS — F29 Unspecified psychosis not due to a substance or known physiological condition: Secondary | ICD-10-CM | POA: Insufficient documentation

## 2014-02-23 DIAGNOSIS — F1094 Alcohol use, unspecified with alcohol-induced mood disorder: Secondary | ICD-10-CM

## 2014-02-23 DIAGNOSIS — F1010 Alcohol abuse, uncomplicated: Secondary | ICD-10-CM | POA: Diagnosis present

## 2014-02-23 DIAGNOSIS — F329 Major depressive disorder, single episode, unspecified: Secondary | ICD-10-CM

## 2014-02-23 DIAGNOSIS — Z79899 Other long term (current) drug therapy: Secondary | ICD-10-CM | POA: Insufficient documentation

## 2014-02-23 DIAGNOSIS — T1490XA Injury, unspecified, initial encounter: Secondary | ICD-10-CM | POA: Diagnosis present

## 2014-02-23 DIAGNOSIS — F39 Unspecified mood [affective] disorder: Secondary | ICD-10-CM

## 2014-02-23 LAB — CBC WITH DIFFERENTIAL/PLATELET
BASOS PCT: 0 % (ref 0–1)
Basophils Absolute: 0 10*3/uL (ref 0.0–0.1)
Eosinophils Absolute: 0.1 10*3/uL (ref 0.0–0.7)
Eosinophils Relative: 1 % (ref 0–5)
HCT: 44.8 % (ref 39.0–52.0)
Hemoglobin: 15.8 g/dL (ref 13.0–17.0)
LYMPHS PCT: 48 % — AB (ref 12–46)
Lymphs Abs: 4.3 10*3/uL — ABNORMAL HIGH (ref 0.7–4.0)
MCH: 31.9 pg (ref 26.0–34.0)
MCHC: 35.3 g/dL (ref 30.0–36.0)
MCV: 90.3 fL (ref 78.0–100.0)
Monocytes Absolute: 0.7 10*3/uL (ref 0.1–1.0)
Monocytes Relative: 8 % (ref 3–12)
NEUTROS ABS: 3.8 10*3/uL (ref 1.7–7.7)
Neutrophils Relative %: 43 % (ref 43–77)
Platelets: 202 10*3/uL (ref 150–400)
RBC: 4.96 MIL/uL (ref 4.22–5.81)
RDW: 12.3 % (ref 11.5–15.5)
WBC: 9 10*3/uL (ref 4.0–10.5)

## 2014-02-23 LAB — RAPID URINE DRUG SCREEN, HOSP PERFORMED
AMPHETAMINES: NOT DETECTED
BARBITURATES: NOT DETECTED
BENZODIAZEPINES: NOT DETECTED
Cocaine: POSITIVE — AB
Opiates: POSITIVE — AB
Tetrahydrocannabinol: POSITIVE — AB

## 2014-02-23 LAB — BASIC METABOLIC PANEL
BUN: 8 mg/dL (ref 6–23)
CALCIUM: 9.1 mg/dL (ref 8.4–10.5)
CO2: 25 mEq/L (ref 19–32)
CREATININE: 1.04 mg/dL (ref 0.50–1.35)
Chloride: 104 mEq/L (ref 96–112)
GFR calc non Af Amer: 90 mL/min — ABNORMAL LOW (ref >90)
Glucose, Bld: 118 mg/dL — ABNORMAL HIGH (ref 70–99)
Potassium: 4.3 mEq/L (ref 3.7–5.3)
Sodium: 144 mEq/L (ref 137–147)

## 2014-02-23 LAB — VALPROIC ACID LEVEL: VALPROIC ACID LVL: 14.2 ug/mL — AB (ref 50.0–100.0)

## 2014-02-23 LAB — ETHANOL: ALCOHOL ETHYL (B): 188 mg/dL — AB (ref 0–11)

## 2014-02-23 MED ORDER — HYDROXYZINE HCL 25 MG PO TABS
25.0000 mg | ORAL_TABLET | Freq: Four times a day (QID) | ORAL | Status: DC | PRN
  Administered 2014-02-23: 25 mg via ORAL
  Filled 2014-02-23: qty 1

## 2014-02-23 MED ORDER — LOPERAMIDE HCL 2 MG PO CAPS: 2.0000 mg | ORAL_CAPSULE | ORAL | Status: DC | PRN

## 2014-02-23 MED ORDER — DIVALPROEX SODIUM ER 500 MG PO TB24
500.0000 mg | ORAL_TABLET | Freq: Three times a day (TID) | ORAL | Status: DC
  Filled 2014-02-23 (×3): qty 1

## 2014-02-23 MED ORDER — ADULT MULTIVITAMIN W/MINERALS CH
1.0000 | ORAL_TABLET | Freq: Every day | ORAL | Status: DC
  Administered 2014-02-23 – 2014-02-24 (×2): 1 via ORAL
  Filled 2014-02-23 (×2): qty 1

## 2014-02-23 MED ORDER — CHLORDIAZEPOXIDE HCL 25 MG PO CAPS: 25.0000 mg | ORAL_CAPSULE | Freq: Three times a day (TID) | ORAL | Status: DC

## 2014-02-23 MED ORDER — OLANZAPINE 5 MG PO TBDP
5.0000 mg | ORAL_TABLET | Freq: Every day | ORAL | Status: DC
  Administered 2014-02-23: 5 mg via ORAL
  Filled 2014-02-23: qty 1

## 2014-02-23 MED ORDER — VITAMIN B-1 100 MG PO TABS
100.0000 mg | ORAL_TABLET | Freq: Every day | ORAL | Status: DC
  Administered 2014-02-24: 100 mg via ORAL
  Filled 2014-02-23: qty 1

## 2014-02-23 MED ORDER — ONDANSETRON 4 MG PO TBDP: 4.0000 mg | ORAL_TABLET | Freq: Four times a day (QID) | ORAL | Status: DC | PRN

## 2014-02-23 MED ORDER — THIAMINE HCL 100 MG/ML IJ SOLN
100.0000 mg | Freq: Once | INTRAMUSCULAR | Status: AC
  Administered 2014-02-23: 100 mg via INTRAMUSCULAR
  Filled 2014-02-23: qty 2

## 2014-02-23 MED ORDER — TRAZODONE HCL 50 MG PO TABS: 50.0000 mg | ORAL_TABLET | Freq: Every evening | ORAL | Status: DC | PRN

## 2014-02-23 MED ORDER — CHLORDIAZEPOXIDE HCL 25 MG PO CAPS: 25.0000 mg | ORAL_CAPSULE | Freq: Every day | ORAL | Status: DC

## 2014-02-23 MED ORDER — BUSPIRONE HCL 10 MG PO TABS
15.0000 mg | ORAL_TABLET | Freq: Three times a day (TID) | ORAL | Status: DC
  Administered 2014-02-23: 15 mg via ORAL
  Filled 2014-02-23: qty 2

## 2014-02-23 MED ORDER — CHLORDIAZEPOXIDE HCL 25 MG PO CAPS: 25.0000 mg | ORAL_CAPSULE | ORAL | Status: DC

## 2014-02-23 MED ORDER — CHLORDIAZEPOXIDE HCL 25 MG PO CAPS
25.0000 mg | ORAL_CAPSULE | Freq: Four times a day (QID) | ORAL | Status: AC
  Administered 2014-02-23 – 2014-02-24 (×4): 25 mg via ORAL
  Filled 2014-02-23 (×4): qty 1

## 2014-02-23 MED ORDER — CHLORDIAZEPOXIDE HCL 25 MG PO CAPS
25.0000 mg | ORAL_CAPSULE | Freq: Once | ORAL | Status: AC
  Administered 2014-02-23: 25 mg via ORAL
  Filled 2014-02-23: qty 1

## 2014-02-23 MED ORDER — NICOTINE 21 MG/24HR TD PT24
21.0000 mg | MEDICATED_PATCH | Freq: Once | TRANSDERMAL | Status: AC
  Administered 2014-02-23: 21 mg via TRANSDERMAL
  Filled 2014-02-23 (×2): qty 1

## 2014-02-23 MED ORDER — CHLORDIAZEPOXIDE HCL 25 MG PO CAPS: 25.0000 mg | ORAL_CAPSULE | Freq: Four times a day (QID) | ORAL | Status: DC | PRN
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
## 2014-02-23 NOTE — BH Assessment (Signed)
Called Maryjean Mornharles Kober, PA-C reports recent contact with Pt, and concerns that he was threatening staff, and not compliant. Maryjean Mornharles Kober, PA-C notes if Pt's report is reliable he meet in-patient criteria but [facility]. TTS will need to seek an appropriate bed for Pt if attending agrees with recommendation.    Called to discuss results and recommendations from TA with Dr. Clarene DukeMcManus. Dr. Clarene DukeMcManus would like to see Pt receive in-patient tx. TTS will seek placement.   Spoke with nurse to discuss plans, and asked her to relay the plan to the Pt. Nurse reports breakfast has been ordered. She will inform Pt of the plan, and inform TTS if he becomes combative.   Clista BernhardtNancy Stephenson, [facility] Triage Specialist 02/23/2014 6:56 AM

## 2014-02-23 NOTE — BH Assessment (Signed)
Tele Assessment Note   Fernando [name] is an 38 y.o. male presented to the ED via ambulance after hearing dogs barking and running from his home in fear. Pt was d/c from [facility] OBS unit on June 19/2015/. During his stay he reported needing to detox, and SI. Pt was threatening to staff and refused OP referrals provided to him. Pt reports he has had SI since being d/c. Pt reports he had 3 long island iced teas because he was stressed around 8 pm, and then reports he started hearing dogs barking. Pt reports he was terrified and ran from the dogs, hurting his arm and ankle. Pt reports he is more scared of the barking auditory hallucinations than of previous ones telling him to kill himself. Pt attempted to call his dad and tell him he was having SI. Pt reports he is "trying to man up" and handle is sx. Pt was cussing at staff, and became upset thinking a staff member was mocking his fears by barking at him. There is no evidence to support this claim. Mood was labile crying and shaking, expressing guilt and fear, to hostile the suspicious the next. Pt appeared to be having a panic attack during the interview and often could not answer due to heavy crying. Pt begged to be helped to understand why he is hallucinating. Pt reports OBS unit made him feel very uneasy due to close quarters. He reports he does not want to hurt anyone but won't let others hurt him.    ** from 02/19/2014 assessment Tele Assessment Note  Fernando [name] is an 38 y.o. male who presents with SI without a plan seeking detox. He reports he hasn't been on his medications in some time and that he has been drinking and using crack cocaine and sometimes pills to help him cope with his symptoms. He states that he last completed detox at [facility] in 2014 and then went to [facility], but there were too many people there and he had panic attacks and didn't like feeling like he was in a boot camp, so he left. He reports drinking around a case of beer daily on and off  for the last twenty years and suspects his longest period of sobriety was 14 mos. He also reports that he uses around an ounce of crack cocaine daily and occasionally takes some pain pills when his foot hurts. He reports that whenever he continues to think of ending his life he comes to the emergency room so it doesn't get to the point where he has a plan. He reports that he has feelings of worthlessness, anger, crying spells, tearfulness, fatigue, insomnia. He denies any thoughts of harming anyone else or history of violence or access to weapons and also denies any auditory or visual hallucinations     Axis I: Adjustment Disorder NOS R/O Psychotic Disorder NOS            Axis II: Personality Disorder NOS, per Maryjean Morn PA-c Axis III:  Past Medical History  Diagnosis Date  . Fx ankle   . Collar bone fracture   . Appendicitis   . Hepatitis C    Axis IV: economic problems, housing problems and other psychosocial or environmental problems Axis V: 21-30 behavior considerably influenced by delusions or hallucinations OR serious impairment in judgment, communication OR inability to function in almost all areas  Past Medical History:  Past Medical History  Diagnosis Date  . Fx ankle   . Collar bone fracture   .  Appendicitis   . Hepatitis C     Past Surgical History  Procedure Laterality Date  . Appendectomy      Family History: History reviewed. No pertinent family history.  Social History:  reports that he has been smoking Cigarettes.  He has a 19 pack-year smoking history. He has never used smokeless tobacco. He reports that he drinks about 4.2 ounces of alcohol per week. He reports that he uses illicit drugs (Cocaine and Marijuana).  Additional Social History:  Alcohol / Drug Use Pain Medications: denied Prescriptions: Buspar, and Depakote Over the Counter: none History of alcohol / drug use?: Yes Substance #1 Name of Substance 1: Alcohol 1 - Age of First Use: 18 taken  today, 3 long island ice teas 1 - Amount (size/oz): 3 long island iced teas at a bar at 8 pm 02-22-2014 1 - Frequency: daily 1 - Duration: 20 years 1 - Last Use / Amount: 11 hours ago Substance #2 Name of Substance 2: crack, 6 days ago 2 - Age of First Use: 25, taken 6 days ago 2 - Amount (size/oz): unable to assess 2 - Frequency: unable to assess, Pt reports he continues to hear dogs barking, and crying, feeling guiily about sx "I just want to be normal" 2 - Duration: unable to assess 2 - Last Use / Amount: unable to assess Substance #3 Name of Substance 3: Various pills 3 - Age of First Use: UTA 3 - Amount (size/oz): UTA 3 - Frequency: UTA 3 - Duration: UTA 3 - Last Use / Amount: UTA Substance #4 Name of Substance 4: marijuana per hx 4 - Age of First Use: UTA 4 - Amount (size/oz): UTA 4 - Frequency: UTA 4 - Duration: UTA 4 - Last Use / Amount: UTA  CIWA: CIWA-Ar BP: 132/61 mmHg Pulse Rate: 100 COWS:    Allergies: No Known Allergies  Home Medications:  (Not in a hospital admission)  OB/GYN Status:  No LMP for male patient.  General Assessment Data Is this a Tele or Face-to-Face Assessment?: Tele Assessment Is this an Initial Assessment or a Re-assessment for this encounter?: Initial Assessment Living Arrangements: Non-relatives/Friends (friends) Can pt return to current living arrangement?: Yes Admission Status: Voluntary Is patient capable of signing voluntary admission?: Yes Transfer from: Home Referral Source: Self/Family/Friend (Pt called ambulance due to hearing dogs barking in his head)     [facility] Living Arrangements: Non-relatives/Friends (friends) Name of Psychiatrist: [facility] Name of Therapist: [facility]  Education Status Highest grade of school patient has completed: 12  Risk to self Suicidal Ideation: Yes-Currently Present (Reports SI since leaving OBS unit 02-21-2014) Suicidal Intent: No (denies intent, reports promised grandmother) Is  patient at risk for suicide?: Yes (problem with reality testing, alcohol and drug use) Suicidal Plan?: No (denies plan) Access to Means: Yes (drugs, and alcohol ) Specify Access to Suicidal Means: denies specific plan at this time What has been your use of drugs/alcohol within the last 12 months?: on going use  Previous Attempts/Gestures: Yes (Pt comes to ED when he has SI) How many times?: 1 Other Self Harm Risks: laid in road per past hx Triggers for Past Attempts:  (anxiety, alcohol) Family Suicide History: Yes (3 cousins) Recent stressful life event(s): Other (Comment) (Panic attacks in OBS, scared of hallucinations) Persecutory voices/beliefs?: Yes (Believes he was being mocked by hospital staff) Depression: Yes Depression Symptoms: Insomnia;Tearfulness;Isolating;Guilt;Loss of interest in usual pleasures;Feeling worthless/self pity;Feeling angry/irritable ("I just want to feel normal.") Substance abuse history and/or treatment  for substance abuse?: Yes  Risk to Others Homicidal Ideation: No Thoughts of Harm to Others: Yes-Currently Present Comment - Thoughts of Harm to Others: Wanting to attack staff member he felt was mocking him, reports he will not act on this because his life is worth more than the staff member's life Current Homicidal Intent: No Current Homicidal Plan: No Access to Homicidal Means: No Identified Victim: male staff member  History of harm to others?: Yes Assessment of Violence: On admission Violent Behavior Description: made threats to multiple staff members Does patient have access to weapons?: No Criminal Charges Pending?: No Does patient have a court date: No  Psychosis Hallucinations: Auditory (dog barking, Pt very fearful) Delusions: Persecutory (feels being mocked by staff)  Mental Status Report Appear/Hygiene: Unremarkable Eye Contact: Good Motor Activity: Agitation (shaking, crying) Speech:  (hard to understand as crying very hard at  times) Level of Consciousness: Alert (fearful, irritated) Mood: Anxious;Depressed;Angry;Suspicious;Guilty;Irritable;Labile (uspet about sx, labile mood from crying to angry ) Affect: Preoccupied (with auditory hallucinations, and perceived slight) Anxiety Level: Panic Attacks Panic attack frequency: few times per week Most recent panic attack: during interview Thought Processes: Circumstantial Judgement: Impaired Orientation: Person;Place;Time;Situation Obsessive Compulsive Thoughts/Behaviors: Minimal  Cognitive Functioning Concentration: Poor Memory: Recent Intact;Remote Intact IQ: Average Insight: Poor Impulse Control: Poor Appetite: Good Weight Loss: 0 Weight Gain: 0 Sleep: Decreased Total Hours of Sleep: 3 Vegetative Symptoms: None  ADLScreening [facility] Assessment [facility]) Patient's cognitive ability adequate to safely complete daily activities?: Yes Patient able to express need for assistance with ADLs?: Yes Independently performs ADLs?: Yes (appropriate for developmental age)  Prior Inpatient Therapy Prior Inpatient Therapy: Yes (reports he calls his mom or dad when upset) Prior Therapy Dates: 2014,  [facility] Reason for Treatment: SA, SI  Prior Outpatient Therapy Prior Outpatient Therapy: Yes Prior Therapy Dates: 2014 Prior Therapy Facilty/Provider(s): [facility] Reason for Treatment: Anxiety  ADL Screening (condition at time of admission) Patient's cognitive ability adequate to safely complete daily activities?: Yes Patient able to express need for assistance with ADLs?: Yes Independently performs ADLs?: Yes (appropriate for developmental age)       Abuse/Neglect Assessment (Assessment to be complete while patient is alone) Physical Abuse: Denies Verbal Abuse: Denies Sexual Abuse: Denies Exploitation of patient/patient's resources: Denies Self-Neglect: Denies Values / Beliefs Cultural Requests During Hospitalization:  None Spiritual Requests During Hospitalization:  (Reports he believes in God, and promised his grandmother that he would not harm himself before she died.)   Advance Directives (For Healthcare) Advance Directive: Patient does not have advance directive Nutrition Screen- [facility] Adult/WL/AP Patient's home diet: Regular  Additional Information 1:1 In Past 12 Months?: No CIRT Risk: Yes Elopement Risk: No Does patient have medical clearance?: Yes     Disposition Aletta EdouardCharles Kober, PA-C and EDP Dr. Clarene DukeMcManus agree Pt meets in-patient criteria. No appropriate bed available at [facility]. Placement will be sought.  Clista BernhardtNancy Stephenson, [facility]PC Triage Specialist 02/23/2014 7:46 AM  STEPHENSON,NANCY Judie PetitM 02/23/2014 7:35 AM

## 2014-02-23 NOTE — ED Notes (Signed)
Per EMS ,pt.was picked up in a gas station who  claimed of SI and hallucination . Pt. Claimed that he is on  psych meds and verbalized  " im scared i may hurt other people if i cant get help".  i wanted to be admitted in a hospital to get help. Denied any pain.

## 2014-02-23 NOTE — ED Provider Notes (Signed)
CSN: [id number]     Arrival date & time 02/23/14  0524 History   First MD Initiated Contact with Patient 02/23/14 0534     Chief Complaint  Patient presents with  . Suicidal  . Hallucinations      The history is provided by the EMS personnel and the patient. The history is limited by the condition of the patient (Agitated, auditory hallucinations).  Pt was seen at 0555. Per EMS and pt report: c/o gradual onset and persistence of constant auditory hallucinations for the past day. Pt states he is "hearing dogs barking." States also he "is scared I may hurt other people if I can't get help." Pt also endorses SI. States he "needs to be admitted."   Past Medical History  Diagnosis Date  . Fx ankle   . Collar bone fracture   . Appendicitis   . Hepatitis C    Past Surgical History  Procedure Laterality Date  . Appendectomy      History  Substance Use Topics  . Smoking status: Current Every Day Smoker -- 1.00 packs/day for 19 years    Types: Cigarettes  . Smokeless tobacco: Never Used  . Alcohol Use: 4.2 oz/week    7 Cans of beer per week    Review of Systems  Unable to perform ROS: Psychiatric disorder      Allergies  Review of patient's allergies indicates no known allergies.  Home Medications   Prior to Admission medications   Medication Sig Start Date End Date Taking? Authorizing Provider  busPIRone (BUSPAR) 15 MG tablet Take 1 tablet (15 mg total) by mouth 3 (three) times daily. For anxiety 11/12/13   Sanjuana KavaAgnes I Nwoko, NP  divalproex (DEPAKOTE ER) 500 MG 24 hr tablet Take 1 tablet (500 mg total) by mouth 3 (three) times daily before meals. 11/11/13   Fransisca KaufmannLaura Davis, NP   BP 132/61  Pulse 100  Temp(Src) 98.4 F (36.9 C) (Oral)  Resp 16  SpO2 96% Physical Exam 0600; Physical examination:  Nursing notes reviewed; Vital signs and O2 SAT reviewed;  Constitutional: Well developed, Well nourished, Well hydrated, In no acute distress; Head:  Normocephalic, atraumatic; Eyes:  EOMI, PERRL, No scleral icterus; ENMT: Mouth and pharynx normal, Mucous membranes moist; Neck: Supple, Full range of motion; Cardiovascular: Regular rate and rhythm, No gallop; Respiratory: Breath sounds clear & equal bilaterally, [place]heezes.  Speaking full sentences with ease, Normal respiratory effort/excursion; Chest: Nontender, Movement normal; Abdomen: Soft, Nontender, Nondistended, Normal bowel sounds;; Extremities: Pulses normal, No tenderness, No edema, No calf edema or asymmetry.; Neuro: AA&Ox3, Major CN grossly intact.  Speech clear. No gross focal motor or sensory deficits in extremities. Climbs on and off stretcher easily by himself. Gait steady.; Skin: Color normal, Warm, Dry.; Psych:  Easily agitated, cursing.    ED Course  Procedures   MDM  MDM Reviewed: previous chart, nursing note and vitals Reviewed previous: labs Interpretation: labs    Results for orders placed during the hospital encounter of 02/23/14  URINE RAPID DRUG SCREEN (HOSP PERFORMED)      Result Value Ref Range   Opiates POSITIVE (*) NONE DETECTED   Cocaine POSITIVE (*) NONE DETECTED   Benzodiazepines NONE DETECTED  NONE DETECTED   Amphetamines NONE DETECTED  NONE DETECTED   Tetrahydrocannabinol POSITIVE (*) NONE DETECTED   Barbiturates NONE DETECTED  NONE DETECTED  ETHANOL      Result Value Ref Range   Alcohol, Ethyl (B) 188 (*) 0 - 11 mg/dL  BASIC METABOLIC PANEL  Result Value Ref Range   Sodium 144  137 - 147 mEq/L   Potassium 4.3  3.7 - 5.3 mEq/L   Chloride 104  96 - 112 mEq/L   CO2 25  19 - 32 mEq/L   Glucose, Bld 118 (*) 70 - 99 mg/dL   BUN 8  6 - 23 mg/dL   Creatinine, Ser 1.611.04  0.50 - 1.35 mg/dL   Calcium 9.1  8.4 - 09.610.5 mg/dL   GFR calc non Af Amer 90 (*) >90 mL/min   GFR calc Af Amer >90  >90 mL/min  CBC WITH DIFFERENTIAL      Result Value Ref Range   WBC 9.0  4.0 - 10.5 K/uL   RBC 4.96  4.22 - 5.81 MIL/uL   Hemoglobin 15.8  13.0 - 17.0 g/dL   HCT 04.544.8  40.939.0 - 81.152.0 %   MCV 90.3   78.0 - 100.0 fL   MCH 31.9  26.0 - 34.0 pg   MCHC 35.3  30.0 - 36.0 g/dL   RDW 91.412.3  78.211.5 - 95.615.5 %   Platelets 202  150 - 400 K/uL   Neutrophils Relative % 43  43 - 77 %   Neutro Abs 3.8  1.7 - 7.7 K/uL   Lymphocytes Relative 48 (*) 12 - 46 %   Lymphs Abs 4.3 (*) 0.7 - 4.0 K/uL   Monocytes Relative 8  3 - 12 %   Monocytes Absolute 0.7  0.1 - 1.0 K/uL   Eosinophils Relative 1  0 - 5 %   Eosinophils Absolute 0.1  0.0 - 0.7 K/uL   Basophils Relative 0  0 - 1 %   Basophils Absolute 0.0  0.0 - 0.1 K/uL    0630:  [facility] has evaluated pt: meets criteria for admission, placement pending.      Laray AngerKathleen M McManus, DO 02/23/14 [phone]

## 2014-02-23 NOTE — ED Notes (Addendum)
Pt. On paper scrubs, wanded by security, personal belongings in a bag contains; a khaki shorts pants, 1 stripe polo shirt , 1pair of red/black Nike rubber  Shoes, 1 black cloth belt ,a pack of cigarette and brown wallet, no cash seen. Kept at the nurses's station.

## 2014-02-23 NOTE — Consult Note (Signed)
  Called by Fernando [name] in ED claiming hallucinosis and initially suicidal ideation which he subsequently denied after being escorted out of Psych Obs for threatening Dr Lucianne MussKumar and myself 6/19  Because he he did not want Outpt services recommended but rather insisted he be admitted his UDS was + for Cocaine and THC at that time with ETOH in the 90s.In ED he now has Cocaine,THC and OPIATES in his UDS with ETOH 188. In Obs he c/o being homeless. Due to his aggressive threats toward [facility] staff and Medical Director he is not acceptable .Since he claims no SI/HI he does not meet criteria for inpt admission. Recommendation is for referral to outside facility for treatment of drug addictions/chemical dependencies with related mood/behavioral disorders

## 2014-02-23 NOTE — ED Notes (Signed)
Bed: [id number] Expected date:  Expected time:  Means of arrival:  Comments: EMS 38yo M SI / hallucinations

## 2014-02-23 NOTE — ED Notes (Signed)
Report called to Marcelino DusterMichelle, RN in psych ED for transfer to RM 42.  Pt's belongings were transferred with the patient to the psych ED nursing station.

## 2014-02-23 NOTE — BH Assessment (Signed)
Asked nurse to set up cart for TA. And to speak with the EDP working with Pt.   Spoke with Dr. Clarene DukeMcManus EPD who reports Pt is extremely agitated and became more so when asked questions. Pt sts he is hearing voices, and the voices are dogs barking. Pt sts he just got Depakote yesterday. When Dr. Clarene DukeMcManus inquired what the medication was for the Pt cussed at her and told her he did not want to speak to her anymore, so the doctor left the room. Dr. Clarene DukeMcManus asks TA be done noting delaying TA will likely not improve Pt's ability to complete the assessment with less agitation.   Clista BernhardtNancy Stephenson, [facility] Triage Specialist 02/23/2014 6:08 AM

## 2014-02-23 NOTE — ED Notes (Signed)
Pt. Continued to claimed of hearing "dogs barking on his head."

## 2014-02-23 NOTE — ED Notes (Signed)
Pt passive SI, will contract for safety.  Will continue to monitor pt for safety.

## 2014-02-23 NOTE — BH Assessment (Addendum)
[facility] [place], and [facility].  -Dossie ArbourAnthony Abygail Galeno, [place]  Disposition [facility]

## 2014-02-23 NOTE — ED Notes (Signed)
Received a call from Nancy,LPC of [facility] advising that pt.has met the criteria for admission,however waiting for placement availability. To report to [facility] if pt.noted to be combative or aggressive, for possible  change of placement.

## 2014-02-24 DIAGNOSIS — F1994 Other psychoactive substance use, unspecified with psychoactive substance-induced mood disorder: Secondary | ICD-10-CM

## 2014-02-24 DIAGNOSIS — F341 Dysthymic disorder: Secondary | ICD-10-CM

## 2014-02-24 DIAGNOSIS — F1910 Other psychoactive substance abuse, uncomplicated: Secondary | ICD-10-CM

## 2014-02-24 DIAGNOSIS — F1010 Alcohol abuse, uncomplicated: Secondary | ICD-10-CM

## 2014-02-24 MED ORDER — BUSPIRONE HCL 10 MG PO TABS
15.0000 mg | ORAL_TABLET | Freq: Three times a day (TID) | ORAL | Status: DC
  Administered 2014-02-24: 15 mg via ORAL
  Filled 2014-02-24: qty 2

## 2014-02-24 MED ORDER — TRAZODONE HCL 50 MG PO TABS: 50.0000 mg | ORAL_TABLET | Freq: Every evening | ORAL | Status: DC | PRN

## 2014-02-24 MED ORDER — BUSPIRONE HCL 15 MG PO TABS: 15.0000 mg | ORAL_TABLET | Freq: Three times a day (TID) | ORAL | Status: DC

## 2014-02-24 NOTE — Discharge Instructions (Signed)
Alcohol Intoxication Alcohol intoxication occurs when you drink enough alcohol that it affects your ability to function. It can be mild or very severe. Drinking a lot of alcohol in a short time is called binge drinking. This can be very harmful. Drinking alcohol can also be more dangerous if you are taking medicines or other drugs. Some of the effects caused by alcohol may include:  Loss of coordination.  Changes in mood and behavior.  Unclear thinking.  Trouble talking (slurred speech).  Throwing up (vomiting).  Confusion.  Slowed breathing.  Twitching and shaking (seizures).  Loss of consciousness. HOME CARE  Do not drive after drinking alcohol.  Drink enough water and fluids to keep your pee (urine) clear or pale yellow. Avoid caffeine.  Only take medicine as told by your doctor. GET HELP IF:  You throw up (vomit) many times.  You do not feel better after a few days.  You frequently have alcohol intoxication. Your doctor can help decide if you should see a substance use treatment counselor. GET HELP RIGHT AWAY IF:  You become shaky when you stop drinking.  You have twitching and shaking.  You throw up blood. It may look bright red or like coffee grounds.  You notice blood in your poop (bowel movements).  You become lightheaded or pass out (faint). MAKE SURE YOU:   Understand these instructions.  Will watch your condition.  Will get help right away if you are not doing well or get worse. Document Released: 02/08/2008 Document Revised: 04/24/2013 Document Reviewed: 01/25/2013 [facility] Patient Information 2015 [place], [place]. This information is not intended to replace advice given to you by your health care provider. Make sure you discuss any questions you have with your health care provider.  Alcohol Problems Most adults who drink alcohol drink in moderation (not a lot) are at low risk for developing problems related to their drinking. However, all drinkers,  including low-risk drinkers, should know about the health risks connected with drinking alcohol. RECOMMENDATIONS FOR LOW-RISK DRINKING  Drink in moderation. Moderate drinking is defined as follows:   Men - no more than 2 drinks per day.  Nonpregnant women - no more than 1 drink per day.  Over age 71 - no more than 1 drink per day. A standard drink is 12 grams of pure alcohol, which is equal to a 12 ounce bottle of beer or wine cooler, a 5 ounce glass of wine, or 1.5 ounces of distilled spirits (such as whiskey, brandy, vodka, or rum).  ABSTAIN FROM (DO NOT DRINK) ALCOHOL:  When pregnant or considering pregnancy.  When taking a medication that interacts with alcohol.  If you are alcohol dependent.  A medical condition that prohibits drinking alcohol (such as ulcer, liver disease, or heart disease). DISCUSS WITH YOUR CAREGIVER:  If you are at risk for coronary heart disease, discuss the potential benefits and risks of alcohol use: Light to moderate drinking is associated with lower rates of coronary heart disease in certain populations (for example, men over age 9 and postmenopausal women). Infrequent or nondrinkers are advised not to begin light to moderate drinking to reduce the risk of coronary heart disease so as to avoid creating an alcohol-related problem. Similar protective effects can likely be gained through proper diet and exercise.  Women and the elderly have smaller amounts of body water than men. As a result women and the elderly achieve a higher blood alcohol concentration after drinking the same amount of alcohol.  Exposing a fetus to  alcohol can cause a broad range of birth defects referred to as Fetal Alcohol Syndrome (FAS) or Alcohol-Related Birth Defects (ARBD). Although FAS/ARBD is connected with excessive alcohol consumption during pregnancy, studies also have reported neurobehavioral problems in infants born to mothers reporting drinking an average of 1 drink per day  during pregnancy.  Heavier drinking (the consumption of more than 4 drinks per occasion by men and more than 3 drinks per occasion by women) impairs learning (cognitive) and psychomotor functions and increases the risk of alcohol-related problems, including accidents and injuries. CAGE QUESTIONS:   Have you ever felt that you should Cut down on your drinking?  Have people Annoyed you by criticizing your drinking?  Have you ever felt bad or Guilty about your drinking?  Have you ever had a drink first thing in the morning to steady your nerves or get rid of a hangover (Eye opener)? If you answered positively to any of these questions: You may be at risk for alcohol-related problems if alcohol consumption is:   Men: Greater than 14 drinks per week or more than 4 drinks per occasion.  Women: Greater than 7 drinks per week or more than 3 drinks per occasion. Do you or your family have a medical history of alcohol-related problems, such as:  Blackouts.  Sexual dysfunction.  Depression.  Trauma.  Liver dysfunction.  Sleep disorders.  Hypertension.  Chronic abdominal pain.  Has your drinking ever caused you problems, such as problems with your family, problems with your work (or school) performance, or accidents/injuries?  Do you have a compulsion to drink or a preoccupation with drinking?  Do you have poor control or are you unable to stop drinking once you have started?  Do you have to drink to avoid withdrawal symptoms?  Do you have problems with withdrawal such as tremors, nausea, sweats, or mood disturbances?  Does it take more alcohol than in the past to get you high?  Do you feel a strong urge to drink?  Do you change your plans so that you can have a drink?  Do you ever drink in the morning to relieve the shakes or a hangover? If you have answered a number of the previous questions positively, it may be time for you to talk to your caregivers, family, and friends  and see if they think you have a problem. Alcoholism is a chemical dependency that keeps getting worse and will eventually destroy your health and relationships. Many alcoholics end up dead, impoverished, or in prison. This is often the end result of all chemical dependency.  Do not be discouraged if you are not ready to take action immediately.  Decisions to change behavior often involve up and down desires to change and feeling like you cannot decide.  Try to think more seriously about your drinking behavior.  Think of the reasons to quit. WHERE TO GO FOR ADDITIONAL INFORMATION   The [place] on Alcohol Abuse and Alcoholism (NIAAA) http://www.bradshaw.com/  [employer] on Alcoholism and Drug Dependence (NCADD) www.ncadd.org  American Society of Addiction Medicine (ASAM) http://carpenter.net/  Document Released: 08/22/2005 Document Revised: 11/14/2011 Document Reviewed: 04/09/2008 [facility] Patient Information 2015 [place]. This information is not intended to replace advice given to you by your health care provider. Make sure you discuss any questions you have with your health care provider.  Drug Abuse and Addiction in Sports There are many types of drugs that one may become addicted to including illegal drugs (marijuana, cocaine, amphetamines, hallucinogens, and narcotics),  prescription drugs (hydrocodone, codeine, and alprazolam), and other chemicals such as alcohol or nicotine. Two types of addiction exist: physical and emotional. Physical addiction usually occurs after prolonged use of a drug. However, some drugs may only take a couple uses before addiction can occur. Physical addiction is marked by withdrawal symptoms, in which the person experiences negative symptoms such as sweat, anxiety, tremors, hallucinations, or cravings in the absence of using the drug. Emotional dependence is the psychological desire for the "high" that the drugs produce when taken. SYMPTOMS    Inattentiveness.  Negligence.  Forgetfulness.  Insomnia.  Mood swings. RISK INCREASES WITH:   Family history of addiction.  Personal history of addictive personality. Studies have shown that risktakers, which many athletes are, have a higher risk of addiction. PREVENTION The only adequate prevention of drug abuse is abstinence from drugs. TREATMENT  The first step in quitting substance abuse is recognizing the problem and realizing that one has the power to change. Quitting requires a plan and support from others. It is often necessary to seek medical assistance. Caregivers are available to offer counseling, and for certain cases, medicine to diminish the physical symptoms of withdrawal. Many organizations exist such as Alcoholics Anonymous, Narcotics Anonymous, or the [employer] on Alcoholism that offer support for individuals who have chosen to quit their habits. Document Released: 08/22/2005 Document Revised: 11/14/2011 Document Reviewed: 12/04/2008 [facility] Patient Information 2015 [place], [place]. This information is not intended to replace advice given to you by your health care provider. Make sure you discuss any questions you have with your health care provider.  Generalized Anxiety Disorder Generalized anxiety disorder (GAD) is a mental disorder. It interferes with life functions, including relationships, work, and school. GAD is different from normal anxiety, which everyone experiences at some point in their lives in response to specific life events and activities. Normal anxiety actually helps [place] prepare for and get through these life events and activities. Normal anxiety goes away after the event or activity is over.  GAD causes anxiety that is not necessarily related to specific events or activities. It also causes excess anxiety in proportion to specific events or activities. The anxiety associated with GAD is also difficult to control. GAD can vary from mild to  severe. People with severe GAD can have intense waves of anxiety with physical symptoms (panic attacks).  SYMPTOMS The anxiety and worry associated with GAD are difficult to control. This anxiety and worry are related to many life events and activities and also occur more days than not for 6 months or longer. People with GAD also have three or more of the following symptoms (one or more in children):  Restlessness.   Fatigue.  Difficulty concentrating.   Irritability.  Muscle tension.  Difficulty sleeping or unsatisfying sleep. DIAGNOSIS GAD is diagnosed through an assessment by your caregiver. Your caregiver will ask you questions aboutyour mood,physical symptoms, and events in your life. Your caregiver may ask you about your medical history and use of alcohol or drugs, including prescription medications. Your caregiver may also do a physical exam and blood tests. Certain medical conditions and the use of certain substances can cause symptoms similar to those associated with GAD. Your caregiver may refer you to a mental health specialist for further evaluation. TREATMENT The following therapies are usually used to treat GAD:   Medication--Antidepressant medication usually is prescribed for long-term daily control. Antianxiety medications may be added in severe cases, especially when panic attacks occur.   Talk therapy (psychotherapy)--Certain types  of talk therapy can be helpful in treating GAD by providing support, education, and guidance. A form of talk therapy called cognitive behavioral therapy can teach you healthy ways to think about and react to daily life events and activities.  Stress managementtechniques--These include yoga, meditation, and exercise and can be very helpful when they are practiced regularly. A mental health specialist can help determine which treatment is best for you. Some people see improvement with one therapy. However, other people require a combination  of therapies. Document Released: 12/17/2012 Document Reviewed: 12/17/2012 [facility] Patient Information 2015 [place]. This information is not intended to replace advice given to you by your health care provider. Make sure you discuss any questions you have with your health care provider.  Mood Disorders Mood disorders are conditions that affect the way a person feels emotionally. The main mood disorders include:  Depression.  Bipolar disorder.  Dysthymia. Dysthymia is a mild, lasting (chronic) depression. Symptoms of dysthymia are similar to depression, but not as severe.  Cyclothymia. Cyclothymia includes mood swings, but the highs and lows are not as severe as they are in bipolar disorder. Symptoms of cyclothymia are similar to those of bipolar disorder, but less extreme. CAUSES  Mood disorders are probably caused by a combination of factors. People with mood disorders seem to have physical and chemical changes in their brains. Mood disorders run in families, so there may be genetic causes. Severe trauma or stressful life events may also increase the risk of mood disorders.  SYMPTOMS  Symptoms of mood disorders depend on the specific type of condition. Depression symptoms include:  Feeling sad, worthless, or hopeless.  Negative thoughts.  Inability to enjoy one's usual activities.  Low energy.  Sleeping too much or too little.  Appetite changes.  Crying.  Concentration problems.  Thoughts of harming oneself. Bipolar disorder symptoms include:  Periods of depression (see above symptoms).  Mood swings, from sadness and depression, to abnormal elation and excitement.  Periods of mania:  Racing thoughts.  Fast speech.  Poor judgment, and careless, dangerous choices.  Decreased need for sleep.  Risky behavior.  Difficulty concentrating.  Irritability.  Increased energy.  Increased sex drive. DIAGNOSIS  There are no blood tests or X-rays that can  confirm a mood disorder. However, your caregiver may choose to run some tests to make sure that there is not another physical cause for your symptoms. A mood disorder is usually diagnosed after an in-depth interview with a caregiver. TREATMENT  Mood disorders can be treated with one or more of the following:  Medicine. This may include antidepressants, mood-stabilizers, or anti-psychotics.  Psychotherapy (talk therapy).  Cognitive behavioral therapy. You are taught to recognize negative thoughts and behavior patterns, and replace them with healthy thoughts and behaviors.  Electroconvulsive therapy. For very severe cases of deep depression, a series of treatments in which an electrical current is applied to the brain.  Vagus nerve stimulation. A pulse of electricity is applied to a portion of the brain.  Transcranial magnetic stimulation. Powerful magnets are placed on the head that produce electrical currents.  Hospitalization. In severe situations, or when someone is having serious thoughts of harming him or herself, hospitalization may be necessary in order to keep the person safe. This is also done to quickly start and monitor treatment. HOME CARE INSTRUCTIONS   Take your medicine exactly as directed.  Attend all of your therapy sessions.  Try to eat regular, healthy meals.  Exercise daily. Exercise may improve mood symptoms.  Get good sleep.  Do not drink alcohol or use pot or other drugs. These can worsen mood symptoms and cause anxiety and psychosis.  Tell your caregiver if you develop any side effects, such as feeling sick to your stomach (nauseous), dry mouth, dizziness, constipation, drowsiness, tremor, weight gain, or sexual symptoms. He or she may suggest things you can do to improve symptoms.  Learn ways to cope with the stress of having a chronic illness. This includes yoga, meditation, tai chi, or participating in a support group.  Drink enough water to keep your  urine clear or pale yellow. Eat a high-fiber diet. These habits may help you avoid constipation from your medicine. SEEK IMMEDIATE MEDICAL CARE IF:  Your mood worsens.  You have thoughts of hurting yourself or others.  You cannot care for yourself.  You develop the sensation of hearing or seeing something that is not actually present (auditory or visual hallucinations).  You develop abnormal thoughts. Document Released: 06/19/2009 Document Revised: 11/14/2011 Document Reviewed: 06/19/2009 [facility] Patient Information 2015 [place], [place]. This information is not intended to replace advice given to you by your health care provider. Make sure you discuss any questions you have with your health care provider.

## 2014-02-24 NOTE — Progress Notes (Signed)
Pt dc home and f/u with [facility]. Patient provided outpatient resources. Pt provided with [employer] ticket and bus pass. Pt assured he has  Valid ID for [employer].   Byrd HesselbachKristen Reed, LCSW [phone]  ED CSW 02/24/2014 1514pm

## 2014-02-24 NOTE — [facility] Suicide Risk Assessment (Cosign Needed)
Suicide Risk Assessment  Discharge Assessment     Demographic Factors:  Male  Total Time spent with patient: 30 minutes Psychiatric Specialty Exam:      Blood pressure 104/67, pulse 59, temperature 97.6 F (36.4 C), temperature source Oral, resp. rate 18, SpO2 98.00%.There is no weight on file to calculate BMI.   General Appearance: Casual   Eye Contact:: Good   Speech: Clear and Coherent   Volume: Normal   Mood: Anxious, Depressed and Irritable   Affect: Congruent and Depressed   Thought Process: Circumstantial and Goal Directed   Orientation: Full (Time, Place, and Person)   Thought Content: Rumination   Suicidal Thoughts: No   Homicidal Thoughts: No   Memory: Immediate; Good  Recent; Good  Remote; Good   Judgement: Poor   Insight: Lacking   Psychomotor Activity: Normal and Tremor   Concentration: Fair   Recall: Good   Fund of Knowledge:Good   Language: Good   Akathisia: No   Handed: Right   AIMS (if indicated):   Assets: Communication Skills  Desire for Improvement  Social Support   Sleep:   Musculoskeletal:  Strength & Muscle Tone: within normal limits  Gait & Station: normal  Patient leans: N/A    Mental Status Per Nursing Assessment::   On Admission:     Current Mental Status by Physician: Patient denies suicidal/homicidal ideation, psychosis, and paranoia  Loss Factors: NA  Historical Factors: Impulsivity  Risk Reduction Factors:   Religious beliefs about death  Continued Clinical Symptoms:  Alcohol/Substance Abuse/Dependencies  Cognitive Features That Contribute To Risk:  Closed-mindedness    Suicide Risk:  Minimal: No identifiable suicidal ideation.  Patients presenting with no risk factors but with morbid ruminations; may be classified as minimal risk based on the severity of the depressive symptoms  Discharge Diagnoses:  AXIS I: Alcohol Abuse, Anxiety Disorder NOS, Depressive Disorder NOS, Substance Abuse and Substance Induced Mood  Disorder  AXIS II: Deferred  AXIS III:  Past Medical History   Diagnosis  Date   .  Fx ankle    .  Collar bone fracture    .  Appendicitis    .  Hepatitis C     AXIS IV: other psychosocial or environmental problems  AXIS V: 61-70 mild symptoms   Plan Of Care/Follow-up recommendations:  Activity:  Resume usual activity Diet:  Resume ususal diet  Is patient on multiple antipsychotic therapies at discharge:  No   Has Patient had three or more failed trials of antipsychotic monotherapy by history:  No  Recommended Plan for Multiple Antipsychotic Therapies: NA    Rankin, Shuvon, FNP-BC 02/24/2014, 2:18 PM

## 2014-02-24 NOTE — Progress Notes (Signed)
[facility] provided pt with a list of primary care resources, highlighting [facility].  °
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
## 2019-08-04 ENCOUNTER — Encounter (HOSPITAL_COMMUNITY): Payer: Self-pay

## 2019-08-04 ENCOUNTER — Other Ambulatory Visit: Payer: Self-pay

## 2019-08-04 ENCOUNTER — Emergency Department (HOSPITAL_COMMUNITY)
Admission: EM | Admit: 2019-08-04 | Discharge: 2019-08-05 | Disposition: A | Discharge status: Other Acute Inpatient Hospital | Payer: Medicaid Other | Attending: Emergency Medicine | Admitting: Emergency Medicine

## 2019-08-04 DIAGNOSIS — Z20828 Contact with and (suspected) exposure to other viral communicable diseases: Secondary | ICD-10-CM | POA: Insufficient documentation

## 2019-08-04 DIAGNOSIS — F1220 Cannabis dependence, uncomplicated: Secondary | ICD-10-CM | POA: Diagnosis not present

## 2019-08-04 DIAGNOSIS — R45851 Suicidal ideations: Secondary | ICD-10-CM | POA: Insufficient documentation

## 2019-08-04 DIAGNOSIS — F1420 Cocaine dependence, uncomplicated: Secondary | ICD-10-CM | POA: Diagnosis not present

## 2019-08-04 DIAGNOSIS — Z590 Homelessness: Secondary | ICD-10-CM | POA: Insufficient documentation

## 2019-08-04 DIAGNOSIS — R440 Auditory hallucinations: Secondary | ICD-10-CM

## 2019-08-04 DIAGNOSIS — F1020 Alcohol dependence, uncomplicated: Secondary | ICD-10-CM | POA: Insufficient documentation

## 2019-08-04 DIAGNOSIS — F17210 Nicotine dependence, cigarettes, uncomplicated: Secondary | ICD-10-CM | POA: Diagnosis not present

## 2019-08-04 DIAGNOSIS — F1994 Other psychoactive substance use, unspecified with psychoactive substance-induced mood disorder: Secondary | ICD-10-CM | POA: Diagnosis not present

## 2019-08-04 DIAGNOSIS — Z79899 Other long term (current) drug therapy: Secondary | ICD-10-CM | POA: Diagnosis not present

## 2019-08-04 DIAGNOSIS — F410 Panic disorder [episodic paroxysmal anxiety] without agoraphobia: Secondary | ICD-10-CM | POA: Insufficient documentation

## 2019-08-04 DIAGNOSIS — F1910 Other psychoactive substance abuse, uncomplicated: Secondary | ICD-10-CM | POA: Diagnosis present

## 2019-08-04 HISTORY — DX: Schizophrenia, unspecified: F20.9

## 2019-08-04 HISTORY — DX: Bipolar disorder, unspecified: F31.9

## 2019-08-04 LAB — SARS CORONAVIRUS 2 BY RT PCR (HOSPITAL ORDER, PERFORMED IN ~~LOC~~ HOSPITAL LAB): SARS Coronavirus 2: NEGATIVE

## 2019-08-04 LAB — COMPREHENSIVE METABOLIC PANEL
ALT: 84 U/L — ABNORMAL HIGH (ref 0–44)
AST: 87 U/L — ABNORMAL HIGH (ref 15–41)
Albumin: 4.7 g/dL (ref 3.5–5.0)
Alkaline Phosphatase: 59 U/L (ref 38–126)
Anion gap: 9 (ref 5–15)
BUN: 9 mg/dL (ref 6–20)
CO2: 25 mmol/L (ref 22–32)
Calcium: 9.4 mg/dL (ref 8.9–10.3)
Chloride: 108 mmol/L (ref 98–111)
Creatinine, Ser: 0.86 mg/dL (ref 0.61–1.24)
GFR calc Af Amer: >60 mL/min (ref >60)
GFR calc non Af Amer: >60 mL/min (ref >60)
Glucose, Bld: 90 mg/dL (ref 70–99)
Potassium: 4.2 mmol/L (ref 3.5–5.1)
Sodium: 142 mmol/L (ref 135–145)
Total Bilirubin: 0.4 mg/dL (ref 0.3–1.2)
Total Protein: 8.7 g/dL — ABNORMAL HIGH (ref 6.5–8.1)

## 2019-08-04 LAB — SALICYLATE LEVEL: Salicylate Lvl: <7 mg/dL (ref 2.8–30.0)

## 2019-08-04 LAB — ACETAMINOPHEN LEVEL: Acetaminophen (Tylenol), Serum: <10 ug/mL — ABNORMAL LOW (ref 10–30)

## 2019-08-04 LAB — CBC
HCT: 51.7 % (ref 39.0–52.0)
Hemoglobin: 17.4 g/dL — ABNORMAL HIGH (ref 13.0–17.0)
MCH: 32.2 pg (ref 26.0–34.0)
MCHC: 33.7 g/dL (ref 30.0–36.0)
MCV: 95.6 fL (ref 80.0–100.0)
Platelets: 237 10*3/uL (ref 150–400)
RBC: 5.41 MIL/uL (ref 4.22–5.81)
RDW: 12.5 % (ref 11.5–15.5)
WBC: 11.6 10*3/uL — ABNORMAL HIGH (ref 4.0–10.5)
nRBC: 0 % (ref 0.0–0.2)

## 2019-08-04 LAB — RAPID URINE DRUG SCREEN, HOSP PERFORMED
Amphetamines: NOT DETECTED
Barbiturates: NOT DETECTED
Benzodiazepines: NOT DETECTED
Cocaine: POSITIVE — AB
Opiates: NOT DETECTED
Tetrahydrocannabinol: POSITIVE — AB

## 2019-08-04 LAB — VALPROIC ACID LEVEL: Valproic Acid Lvl: <10 ug/mL — ABNORMAL LOW (ref 50.0–100.0)

## 2019-08-04 LAB — ETHANOL: Alcohol, Ethyl (B): 83 mg/dL — ABNORMAL HIGH (ref <10)

## 2019-08-04 MED ORDER — THIAMINE HCL 100 MG/ML IJ SOLN: 100.0000 mg | Freq: Every day | INTRAMUSCULAR | Status: DC

## 2019-08-04 MED ORDER — FOLIC ACID 1 MG PO TABS
1.0000 mg | ORAL_TABLET | Freq: Once | ORAL | Status: AC
  Administered 2019-08-04: 16:00:00 1 mg via ORAL
  Filled 2019-08-04: qty 1

## 2019-08-04 MED ORDER — LORAZEPAM 1 MG PO TABS: 0.0000 mg | ORAL_TABLET | Freq: Two times a day (BID) | ORAL | Status: DC

## 2019-08-04 MED ORDER — LORAZEPAM 2 MG/ML IJ SOLN
1.0000 mg | Freq: Once | INTRAMUSCULAR | Status: AC
  Administered 2019-08-04: 1 mg via INTRAMUSCULAR
  Filled 2019-08-04: qty 1

## 2019-08-04 MED ORDER — VITAMIN B-1 100 MG PO TABS
100.0000 mg | ORAL_TABLET | Freq: Every day | ORAL | Status: DC
  Administered 2019-08-04 – 2019-08-05 (×2): 100 mg via ORAL
  Filled 2019-08-04 (×2): qty 1

## 2019-08-04 MED ORDER — LORAZEPAM 2 MG/ML IJ SOLN: 0.0000 mg | Freq: Four times a day (QID) | INTRAMUSCULAR | Status: DC

## 2019-08-04 MED ORDER — LORAZEPAM 1 MG PO TABS
0.0000 mg | ORAL_TABLET | Freq: Four times a day (QID) | ORAL | Status: DC
  Administered 2019-08-05: 22:00:00 1 mg via ORAL
  Filled 2019-08-04: qty 1

## 2019-08-04 MED ORDER — LORAZEPAM 2 MG/ML IJ SOLN: 0.0000 mg | Freq: Two times a day (BID) | INTRAMUSCULAR | Status: DC
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
## 2019-08-04 NOTE — Progress Notes (Signed)
   08/04/19 1300  General Assessment Data  Reason for not completing assessment [place] attempted to assess pt multiple times but was not able to because pt was in the hallway.  TTS will assess pt at a later time.    Sameeha Rockefeller L. Bove[place], [place], [facility], [facility] Therapeutic Triage Specialist  [phone]

## 2019-08-04 NOTE — ED Notes (Signed)
Per Dr. Tamera Punt, called [facility] and to have a rapid test ordered. Spoke with Magda Paganini at [facility] lab to change the order to a rapid. She will run the same swab that they received earlier

## 2019-08-04 NOTE — ED Notes (Signed)
Pt to room 35. Pt alert, calm, cooperative, no s/s of distress. Pt endorses SI with no plan.

## 2019-08-04 NOTE — ED Provider Notes (Signed)
Medical screening examination/treatment/procedure(s) were conducted as a shared visit with non-physician practitioner(s) and myself.  I personally evaluated the patient during the encounter.    43 year old male here with increased anxiety and suicidal ideations.  Patient is a danger to self and has been IVC.  Will consult TTS for placement   Lacretia Leigh, MD 08/04/19 1059

## 2019-08-04 NOTE — [facility] Counselor (Signed)
  [place] ASSESSMENT DISPOSITION  [facility] discussed case with [place] Provider, Priscille Loveless, NP who recommends observe overnight for safety and stability and reevaluate in the AM by psychiatry  Jacklyn Branan L. [place], [place], [facility], [facility] Therapeutic Triage Specialist  [phone]
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
## 2019-08-04 NOTE — ED Triage Notes (Signed)
Patient states he is having a panic attack . Patient also reports that he is suicidal. Patient states he laid in the middle of the road and a stranger picked him up and called for help. Patient tearful in triage. Patient states he last drank alcohol yesterday. Marijuana daily. Patient states he hears voices that tell him to kill himself. I listened to the voices today. Patient states he does not want to hurt other people.  Patient has frequent episodes where he yells out "Lord I hate you. Lord why does other people get to be normal and I'm not., etc.

## 2019-08-05 ENCOUNTER — Encounter (HOSPITAL_COMMUNITY): Payer: Self-pay | Admitting: Registered Nurse

## 2019-08-05 ENCOUNTER — Observation Stay (HOSPITAL_BASED_OUTPATIENT_CLINIC_OR_DEPARTMENT_OTHER)
Admission: AD | Admit: 2019-08-05 | Discharge: 2019-08-08 | Disposition: A | Discharge status: Home / Self Care | Payer: Medicaid Other | Attending: Psychiatry | Admitting: Psychiatry

## 2019-08-05 DIAGNOSIS — F1994 Other psychoactive substance use, unspecified with psychoactive substance-induced mood disorder: Secondary | ICD-10-CM | POA: Insufficient documentation

## 2019-08-05 DIAGNOSIS — Z590 Homelessness: Secondary | ICD-10-CM | POA: Insufficient documentation

## 2019-08-05 DIAGNOSIS — F322 Major depressive disorder, single episode, severe without psychotic features: Secondary | ICD-10-CM

## 2019-08-05 DIAGNOSIS — F17210 Nicotine dependence, cigarettes, uncomplicated: Secondary | ICD-10-CM | POA: Insufficient documentation

## 2019-08-05 DIAGNOSIS — F10230 Alcohol dependence with withdrawal, uncomplicated: Secondary | ICD-10-CM

## 2019-08-05 DIAGNOSIS — F1420 Cocaine dependence, uncomplicated: Secondary | ICD-10-CM | POA: Diagnosis present

## 2019-08-05 DIAGNOSIS — F1020 Alcohol dependence, uncomplicated: Secondary | ICD-10-CM | POA: Diagnosis present

## 2019-08-05 MED ORDER — LORAZEPAM 1 MG PO TABS: 1.0000 mg | ORAL_TABLET | Freq: Two times a day (BID) | ORAL | Status: DC

## 2019-08-05 MED ORDER — LORAZEPAM 1 MG PO TABS
1.0000 mg | ORAL_TABLET | Freq: Four times a day (QID) | ORAL | Status: DC | PRN
  Administered 2019-08-07: 1 mg via ORAL
  Filled 2019-08-05: qty 1

## 2019-08-05 MED ORDER — LORAZEPAM 1 MG PO TABS: 0.0000 mg | ORAL_TABLET | Freq: Four times a day (QID) | ORAL | Status: DC

## 2019-08-05 MED ORDER — HYDROXYZINE HCL 25 MG PO TABS
25.0000 mg | ORAL_TABLET | Freq: Four times a day (QID) | ORAL | Status: DC | PRN
  Administered 2019-08-07: 25 mg via ORAL
  Filled 2019-08-05: qty 1

## 2019-08-05 MED ORDER — ONDANSETRON 4 MG PO TBDP: 4.0000 mg | ORAL_TABLET | Freq: Four times a day (QID) | ORAL | Status: DC | PRN

## 2019-08-05 MED ORDER — LORAZEPAM 2 MG/ML IJ SOLN: 0.0000 mg | Freq: Four times a day (QID) | INTRAMUSCULAR | Status: DC

## 2019-08-05 MED ORDER — VITAMIN B-1 100 MG PO TABS
100.0000 mg | ORAL_TABLET | Freq: Every day | ORAL | Status: DC
  Administered 2019-08-06 – 2019-08-07 (×2): 100 mg via ORAL
  Filled 2019-08-05 (×2): qty 1

## 2019-08-05 MED ORDER — LORAZEPAM 1 MG PO TABS
1.0000 mg | ORAL_TABLET | Freq: Four times a day (QID) | ORAL | Status: AC
  Administered 2019-08-06 – 2019-08-07 (×5): 1 mg via ORAL
  Filled 2019-08-05 (×5): qty 1

## 2019-08-05 MED ORDER — TRAZODONE HCL 50 MG PO TABS
50.0000 mg | ORAL_TABLET | Freq: Every evening | ORAL | Status: DC | PRN
  Administered 2019-08-07: 50 mg via ORAL
  Filled 2019-08-05: qty 1

## 2019-08-05 MED ORDER — ADULT MULTIVITAMIN W/MINERALS CH
1.0000 | ORAL_TABLET | Freq: Every day | ORAL | Status: DC
  Administered 2019-08-06 – 2019-08-07 (×2): 1 via ORAL
  Filled 2019-08-05 (×2): qty 1

## 2019-08-05 MED ORDER — LOPERAMIDE HCL 2 MG PO CAPS: 2.0000 mg | ORAL_CAPSULE | ORAL | Status: DC | PRN

## 2019-08-05 MED ORDER — LORAZEPAM 1 MG PO TABS: 1.0000 mg | ORAL_TABLET | Freq: Three times a day (TID) | ORAL | Status: DC

## 2019-08-05 MED ORDER — LORAZEPAM 1 MG PO TABS: 0.0000 mg | ORAL_TABLET | Freq: Two times a day (BID) | ORAL | Status: DC

## 2019-08-05 MED ORDER — LORAZEPAM 1 MG PO TABS: 1.0000 mg | ORAL_TABLET | Freq: Every day | ORAL | Status: DC

## 2019-08-05 MED ORDER — LORAZEPAM 2 MG/ML IJ SOLN: 0.0000 mg | Freq: Two times a day (BID) | INTRAMUSCULAR | Status: DC

## 2019-08-05 NOTE — ED Notes (Signed)
Received Fernando [name] at the change of shift awake in his bed. He is OOB at intervals to the nurses station and the bathroom. He received a snack of his choice. He is aware of his transfer to [facility]. Report was called and given to nurse Joycelyn Schmid in [place]. The patient was transported at 2158 hrs without incident.

## 2019-08-05 NOTE — Discharge Summary (Signed)
  [name], 43 y.o., male patient seen via tele psych by this provider, Dr. Dwyane Dee; and chart reviewed on 08/05/19.  On evaluation Fernando [name] reports that he continues to be depressed and having suicidal ideation.  States that he doesn't feel safe being discharged right now.  Patient unable to contract for safety, but states that he is better than he was yesterday.  During evaluation [name] is alert/oriented x 4; calm/cooperative; and mood is congruent with affect.  He does not appear to be responding to internal/external stimuli or delusional thoughts.  Patient denies homicidal ideation, psychosis, and paranoia.  Patient answered question appropriately.    Disposition:  Patient is to be transferred to [place] observation unit

## 2019-08-05 NOTE — ED Notes (Signed)
Patient awake to use the restroom. Patient denies any hallucinations. CIWA score is a 0

## 2019-08-05 NOTE — BH Assessment (Signed)
[facility] Assessment Progress Note  Per Shuvon Rankin, FNP, this pt is to be admitted to the [facility] Observation Unit.  Nonah Mattes, RN has assigned pt to Obs Rm 207.  Pt presents under IVC initiated by EDP Lacretia Leigh, MD, and IVC documents have been sent to [facility].  Pt's nurse, Nena Jordan, has been notified, and agrees to call report to [phone].  Pt is to be transported via Event organiser.   Jalene Mullet, [place] Coordinator [phone]

## 2019-08-06 ENCOUNTER — Other Ambulatory Visit: Payer: Self-pay

## 2019-08-06 ENCOUNTER — Encounter (HOSPITAL_COMMUNITY): Payer: Self-pay

## 2019-08-06 MED ORDER — NICOTINE 21 MG/24HR TD PT24
21.0000 mg | MEDICATED_PATCH | Freq: Every day | TRANSDERMAL | Status: DC
  Administered 2019-08-06 – 2019-08-07 (×2): 21 mg via TRANSDERMAL
  Filled 2019-08-06 (×2): qty 1

## 2019-08-06 NOTE — Progress Notes (Signed)
Patient ID: Fernando [name], male   DOB: 11-13-1975, 43 y.o.   MRN: [id number] Pt brought to [facility] by 2 Policeman. Pt is Involuntary; alert and oriented x 3. Pt states he has thoughts of self harm with no specific plan. Denies HI and AVH at this time. Pt was irritable during assessment. Pt said " I had a Panic Attack so bad I couldn't help it and I just wanted to die so I laid in the road" Pt states that he is homeless. Pt contracted for safety. No complaints voiced or apparent distress noted. Pt refused skin assessment at this time. Will continue to monitor for safety q 15 mins. Pt lying in bad resting quietly.

## 2019-08-06 NOTE — Progress Notes (Signed)
[facility] MD Progress Note  08/06/2019 1:46 PM Fernando [name]  MRN:  [id number] Subjective: Patient assessed by nurse practitioner.  Patient alert and oriented for assessment, answers appropriately.  Patient endorses suicidal ideations, "yesterday I just lay down in the road."  Patient denies homicidal ideations.  Patient denies hallucinations.  Patient denies access to weapons.  Patient states " I am homeless, it is a rough life and it is hard to sleep. Patient states, "I am working on getting my disability, I have a hearing on December 22." Patient states " I do not want to die, I have a good support system." Patient verbalizes frustration with inability to work and pending disability hearing. Dr Jama Flavors agrees with plan for inpatient admission.   Principal Problem: MDD (major depressive disorder), severe (HCC) Diagnosis: Principal Problem:   MDD (major depressive disorder), severe (HCC) Active Problems:   Alcohol dependence (HCC)  Total Time spent with patient: 30 minutes  Past Psychiatric History: Major depressive disorder, alcohol dependence, cocaine dependence, generalized anxiety disorder  Past Medical History:  Past Medical History:  Diagnosis Date  . Appendicitis   . Bipolar 1 disorder (HCC)   . Collar bone fracture   . Fx ankle   . Hepatitis C   . Schizophrenia [facility])     Past Surgical History:  Procedure Laterality Date  . ANKLE FRACTURE SURGERY Left   . APPENDECTOMY     Family History: History reviewed. No pertinent family history. Family Psychiatric  History: Denies Social History:  Social History   Substance and Sexual Activity  Alcohol Use Yes  . Alcohol/week: 7.0 standard drinks  . Types: 7 Cans of beer per week     Social History   Substance and Sexual Activity  Drug Use Yes  . Types: Cocaine, Marijuana    Social History   Socioeconomic History  . Marital status: Single    Spouse name: Not on file  . Number of children: Not on file  . Years of education:  Not on file  . Highest education level: Not on file  Occupational History  . Not on file  Social Needs  . Financial resource strain: Not on file  . Food insecurity    Worry: Not on file    Inability: Not on file  . Transportation needs    Medical: Not on file    Non-medical: Not on file  Tobacco Use  . Smoking status: Current Every Day Smoker    Packs/day: 1.00    Years: 19.00    Pack years: 19.00    Types: Cigarettes  . Smokeless tobacco: Never Used  Substance and Sexual Activity  . Alcohol use: Yes    Alcohol/week: 7.0 standard drinks    Types: 7 Cans of beer per week  . Drug use: Yes    Types: Cocaine, Marijuana  . Sexual activity: Never  Lifestyle  . Physical activity    Days per week: Not on file    Minutes per session: Not on file  . Stress: Not on file  Relationships  . Social Musician on phone: Not on file    Gets together: Not on file    Attends religious service: Not on file    Active member of club or organization: Not on file    Attends meetings of clubs or organizations: Not on file    Relationship status: Not on file  Other Topics Concern  . Not on file  Social History Narrative  . Not on  file   Additional Social History:                         Sleep: Good  Appetite:  Good  Current Medications: Current Facility-Administered Medications  Medication Dose Route Frequency Provider Last Rate Last Dose  . hydrOXYzine (ATARAX/VISTARIL) tablet 25 mg  25 mg Oral Q6H PRN Rankin, Shuvon B, NP      . loperamide (IMODIUM) capsule 2-4 mg  2-4 mg Oral PRN Rankin, Shuvon B, NP      . LORazepam (ATIVAN) tablet 1 mg  1 mg Oral Q6H PRN Rankin, Shuvon B, NP      . LORazepam (ATIVAN) tablet 1 mg  1 mg Oral QID Nira ConnBerry, Jason A, NP   1 mg at 08/06/19 1327   Followed by  . [START ON 08/07/2019] LORazepam (ATIVAN) tablet 1 mg  1 mg Oral TID Jackelyn PolingBerry, Jason A, NP       Followed by  . [START ON 08/08/2019] LORazepam (ATIVAN) tablet 1 mg  1 mg Oral BID  Jackelyn PolingBerry, Jason A, NP       Followed by  . [START ON 08/10/2019] LORazepam (ATIVAN) tablet 1 mg  1 mg Oral Daily Nira ConnBerry, Jason A, NP      . multivitamin with minerals tablet 1 tablet  1 tablet Oral Daily Rankin, Shuvon B, NP   1 tablet at 08/06/19 0854  . ondansetron (ZOFRAN-ODT) disintegrating tablet 4 mg  4 mg Oral Q6H PRN Rankin, Shuvon B, NP      . thiamine (VITAMIN B-1) tablet 100 mg  100 mg Oral Daily Rankin, Shuvon B, NP   100 mg at 08/06/19 0854  . traZODone (DESYREL) tablet 50 mg  50 mg Oral QHS PRN Rankin, Shuvon B, NP        Lab Results: No results found for this or any previous visit (from the past 48 hour(s)).  Blood Alcohol level:  Lab Results  Component Value Date   ETH 83 (H) 08/04/2019   ETH 188 (H) 02/23/2014    Metabolic Disorder Labs: No results found for: HGBA1C, MPG No results found for: PROLACTIN No results found for: CHOL, TRIG, HDL, CHOLHDL, VLDL, LDLCALC  Physical Findings: AIMS: Facial and Oral Movements Muscles of Facial Expression: None, normal Lips and Perioral Area: None, normal Jaw: None, normal Tongue: None, normal,Extremity Movements Upper (arms, wrists, hands, fingers): None, normal Lower (legs, knees, ankles, toes): None, normal, Trunk Movements Neck, shoulders, hips: None, normal, Overall Severity Severity of abnormal movements (highest score from questions above): None, normal Incapacitation due to abnormal movements: None, normal Patient's awareness of abnormal movements (rate only patient's report): Aware, no distress, Dental Status Current problems with teeth and/or dentures?: No Does patient usually wear dentures?: No  CIWA:  CIWA-Ar Total: 0 COWS:  COWS Total Score: 0  Musculoskeletal: Strength & Muscle Tone: within normal limits Gait & Station: normal Patient leans: N/A  Psychiatric Specialty Exam: Physical Exam  Nursing note and vitals reviewed. Constitutional: He is oriented to person, place, and time. He appears well-developed.   HENT:  Head: Normocephalic.  Cardiovascular: Normal rate.  Respiratory: Effort normal.  Neurological: He is alert and oriented to person, place, and time.  Psychiatric: He expresses impulsivity. He expresses suicidal ideation. He expresses suicidal plans.    Review of Systems  Constitutional: Negative.   HENT: Negative.   Eyes: Negative.   Respiratory: Negative.   Cardiovascular: Negative.   Gastrointestinal: Negative.   Genitourinary: Negative.  Musculoskeletal: Negative.   Skin: Negative.   Neurological: Negative.   Endo/Heme/Allergies: Negative.   Psychiatric/Behavioral: Positive for substance abuse.    Blood pressure 125/83, pulse 69, temperature 98.7 F (37.1 C), temperature source Oral, resp. rate 16, SpO2 99 %.There is no height or weight on file to calculate BMI.  General Appearance: Casual  Eye Contact:  Good  Speech:  Clear and Coherent and Normal Rate  Volume:  Normal  Mood:  Depressed  Affect:  Appropriate  Thought Process:  Coherent, Goal Directed and Descriptions of Associations: Intact  Orientation:  Full (Time, Place, and Person)  Thought Content:  Logical  Suicidal Thoughts:  Yes.  with intent/plan  Homicidal Thoughts:  No  Memory:  Immediate;   Good Recent;   Good Remote;   Good  Judgement:  Good  Insight:  Good  Psychomotor Activity:  Normal  Concentration:  Concentration: Good and Attention Span: Good  Recall:  Good  Fund of Knowledge:  Good  Language:  Good  Akathisia:  No  Handed:  Right  AIMS (if indicated):     Assets:  Communication Skills Desire for Improvement Financial Resources/Insurance Social Support  ADL's:  Intact  Cognition:  WNL  Sleep:        Treatment Plan Summary: Daily contact with patient to assess and evaluate symptoms and progress in treatment  Emmaline Kluver, FNP 08/06/2019, 1:46 PM

## 2019-08-06 NOTE — BH Assessment (Signed)
[facility] Assessment Progress Note  Per Letitia Libra, FNP, this pt requires psychiatric hospitalization at this time.  Pt presents under IVC initiated by EDP Lacretia Leigh, MD.  Pt has been referred to [facility].  Final disposition is pending as of this writing.   Jalene Mullet, [place] Coordinator [phone]

## 2019-08-06 NOTE — Progress Notes (Signed)
Pt visible in milieu majority of this shift. Observed interacting well with peers and staff. Mood remains labile with periods of intermittent crying spells and laughter. Denies HI, AVH and pain. Continues to endorse passive SI but contracts for safety. Pt compliant with scheduled medications when offered. Denies adverse drug reactions at this time. Emotional support offered to pt throughout this shift. Writer encouraged pt to voice concerns. Pt pending placement. Q 15 minutes safety checks maintained without self harm gestures or outburst to note at this time.
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
## 2019-08-06 NOTE — Plan of Care (Signed)
[place]  Reason for Crisis Plan:  Crisis Stabilization   Plan of Care:  Referral for Inpatient Hospitalization  Family Support:      Current Living Environment:     Insurance:   Hospital Account    Name Acct ID Class Status Primary Coverage   Fernando [name], Fernando [name] [id number] [place] Open None        Guarantor Account (for Hospital Account [id number])    Name Relation to [place]? Acct Type   Fernando [name], [name]   Address Phone       [address] [place], [place] [address] [phone])          Coverage Information (for Hospital Account [id number])    Not on file      Legal Guardian:     Primary Care Provider:  Patient, No Pcp Per  Current Outpatient Providers:  [facility]  Psychiatrist:     Counselor/Therapist:     Compliant with Medications:  No  Additional Information:   Fernando [name] 12/1/20203:36 AM

## 2019-08-07 ENCOUNTER — Encounter (HOSPITAL_COMMUNITY): Payer: Self-pay | Admitting: Registered Nurse

## 2019-08-07 ENCOUNTER — Ambulatory Visit (HOSPITAL_COMMUNITY): Payer: Self-pay

## 2019-08-07 DIAGNOSIS — F1994 Other psychoactive substance use, unspecified with psychoactive substance-induced mood disorder: Secondary | ICD-10-CM

## 2019-08-07 DIAGNOSIS — F1020 Alcohol dependence, uncomplicated: Secondary | ICD-10-CM

## 2019-08-07 DIAGNOSIS — F1420 Cocaine dependence, uncomplicated: Secondary | ICD-10-CM

## 2019-08-07 LAB — HEPATIC FUNCTION PANEL
ALT: 62 U/L — ABNORMAL HIGH (ref 0–44)
AST: 43 U/L — ABNORMAL HIGH (ref 15–41)
Albumin: 4.2 g/dL (ref 3.5–5.0)
Alkaline Phosphatase: 52 U/L (ref 38–126)
Bilirubin, Direct: 0.2 mg/dL (ref 0.0–0.2)
Indirect Bilirubin: 0.3 mg/dL (ref 0.3–0.9)
Total Bilirubin: 0.5 mg/dL (ref 0.3–1.2)
Total Protein: 7.2 g/dL (ref 6.5–8.1)

## 2019-08-07 MED ORDER — DIVALPROEX SODIUM 250 MG PO DR TAB: 250.0000 mg | DELAYED_RELEASE_TABLET | Freq: Two times a day (BID) | ORAL | 0 refills | Status: AC

## 2019-08-07 MED ORDER — BUSPIRONE HCL 5 MG PO TABS
10.0000 mg | ORAL_TABLET | Freq: Two times a day (BID) | ORAL | Status: DC
  Filled 2019-08-07: qty 2

## 2019-08-07 MED ORDER — DIVALPROEX SODIUM 250 MG PO DR TAB
250.0000 mg | DELAYED_RELEASE_TABLET | Freq: Two times a day (BID) | ORAL | Status: DC
  Administered 2019-08-07 (×2): 250 mg via ORAL
  Filled 2019-08-07 (×2): qty 1

## 2019-08-07 MED ORDER — DIVALPROEX SODIUM 250 MG PO DR TAB
250.0000 mg | DELAYED_RELEASE_TABLET | Freq: Two times a day (BID) | ORAL | Status: DC
  Filled 2019-08-07: qty 60

## 2019-08-07 MED ORDER — CITALOPRAM HYDROBROMIDE 10 MG PO TABS
10.0000 mg | ORAL_TABLET | Freq: Every day | ORAL | Status: DC
  Filled 2019-08-07: qty 1

## 2019-08-07 NOTE — Progress Notes (Signed)
[place], spoke with JC in pharmacy.  He states will fill prescription for Depakote DR 250 mg, 1 mos supply and send to [facility] OBS unit for pt to take to [facility] in am.

## 2019-08-07 NOTE — Patient Outreach (Signed)
CPSS met with the patient in order to provide substance use recovery support and help with getting connected to substance use recovery resources. Patient is interested in getting connected to a residential substance use treatment center. Patient reports a history of poly substance use with alcohol, cocaine, and cannabis use. CPSS was able to set up the patient with an assessment appointment at [facility] in [facility] [place] tomorrow morning /08/08/19 at 7:45 am. Patient will use [employer]/transportation to transport him directly to [facility] [place] for his assessment screening at 7:45 am 08/08/19. Per June at [facility], patient must have a month's supply of any standing medication he will need, as well as a prescription for refills for another month. This has been discussed with Earleen Newport, [place] also provided the patient with information for several additional substance use recovery resources. Some of these resources include residential/outpatient substance use treatment center list, [facility] NA meeting list, [facility] list, and CPSS contact information. CPSS strongly encouraged the patient to follow up with CPSS if needed for further help with CPSS substance use recovery resources.

## 2019-08-07 NOTE — Progress Notes (Signed)
1 large [employer], l black & [employer] Suitcase and 1 small Red Dufflebag placed in Backroom on shelf.

## 2019-08-07 NOTE — Progress Notes (Signed)
Pt very upset and irritable, angry at present, in reference to clothes being delivered to [facility], so pt can take clothes to [facility]  Ativan and Trazodone given for anxiety.

## 2019-08-07 NOTE — Progress Notes (Signed)
Patient ID: Fernando [name], male   DOB: 01-22-1976, 43 y.o.   MRN: [id number] Pt A&O x 4, resting at present, no distress noted, calm & cooperative.  Pt very happy, states he is going to [facility] in am.  Monitoring for safety.

## 2019-08-07 NOTE — Progress Notes (Signed)
Patient ID: Fernando [name], male   DOB: June 23, 1976, 43 y.o.   MRN: [id number] Pt noted sitting in dayroom at the beginning of this shift watching TV. Pt is alert and oriented x 4; calm and cooperative with staff. No apparent distress noted or complaints voiced. Pt states that he has thoughts of SI sometimes during the day without a plan-Passive SI. Denies HI and AVH. Pt contracted for safety. Will continue to monitor for safety q 15 mins.

## 2019-08-07 NOTE — Progress Notes (Signed)
CSW assisted with transportation for patient to arrive at [facility] for residential substance use treatment admission tomorrow, Thursday, 12/08.  Patient is agreeable to use [employer] transportation and signed ROI. Patient states he has no questions or concerns regarding the transportation.  Patient was informed that a vehicle will arrive at [facility] at 7:00am tomorrow to transport him directly to [facility] for his 7:45am screening.  Discharge nurse, please make sure patient is outside waiting for ride at 7:00am.  Stephanie Acre, MSW, [place] Worker [facility] Adult Unit  [phone]

## 2019-08-07 NOTE — Progress Notes (Signed)
Writer attempted to get an EKG and pt is refusing at this time.

## 2019-08-07 NOTE — Discharge Summary (Addendum)
[facility]  08/07/2019 2:03 PM Fernando [name]  MRN:  [id number] Principal Problem: Substance induced mood disorder [facility](HCC) Discharge Diagnoses: Principal Problem:   Substance induced mood disorder (HCC) Active Problems:   Cocaine dependence, abuse (HCC)   Alcohol dependence (HCC)   MDD (major depressive disorder), severe (HCC)   Subjective: Fernando [name], 43 y.o., male patient seen via tele psych by this provider, Dr. Jola Babinskilary; and chart reviewed on 08/07/19.  On evaluation Fernando [name] reports he is feeling better.  States he was feeling hopeless related to substance use problem and would like to get into a rehab facility.  Reports Depakote has worked best for him.  At this time patient denies suicidal/self-harm/homicidal ideation, psychosis, and paranoia. During evaluation Fernando [name] is alert/oriented x 4; calm/cooperative; and mood is congruent with affect.  He does not appear to be responding to internal/external stimuli or delusional thoughts.  Patient denies suicidal/self-harm/homicidal ideation, psychosis, and paranoia.  Patient answered question appropriately.  Depakote restarted.  Peers support ordered.   Total Time spent with patient: 30 minutes  Past Psychiatric History: Polysubstance abuse, substance-induced mood disorder, and depression  Past Medical History:  Past Medical History:  Diagnosis Date  . Appendicitis   . Bipolar 1 disorder (HCC)   . Collar bone fracture   . Fx ankle   . Hepatitis C   . Schizophrenia [facility](HCC)     Past Surgical History:  Procedure Laterality Date  . ANKLE FRACTURE SURGERY Left   . APPENDECTOMY     Family History: History reviewed. No pertinent family history. Family Psychiatric  History: Denies Social History:  Social History   Substance and Sexual Activity  Alcohol Use Yes  . Alcohol/week: 7.0 standard drinks  . Types: 7 Cans of beer per week     Social History   Substance and Sexual Activity  Drug Use Yes   . Types: Cocaine, Marijuana    Social History   Socioeconomic History  . Marital status: Single    Spouse name: Not on file  . Number of children: Not on file  . Years of education: Not on file  . Highest education level: Not on file  Occupational History  . Not on file  Social Needs  . Financial resource strain: Not on file  . Food insecurity    Worry: Not on file    Inability: Not on file  . Transportation needs    Medical: Not on file    Non-medical: Not on file  Tobacco Use  . Smoking status: Current Every Day Smoker    Packs/day: 1.00    Years: 19.00    Pack years: 19.00    Types: Cigarettes  . Smokeless tobacco: Never Used  Substance and Sexual Activity  . Alcohol use: Yes    Alcohol/week: 7.0 standard drinks    Types: 7 Cans of beer per week  . Drug use: Yes    Types: Cocaine, Marijuana  . Sexual activity: Never  Lifestyle  . Physical activity    Days per week: Not on file    Minutes per session: Not on file  . Stress: Not on file  Relationships  . Social Musicianconnections    Talks on phone: Not on file    Gets together: Not on file    Attends religious service: Not on file    Active member of club or organization: Not on file    Attends meetings of clubs or organizations: Not on file    Relationship status: Not  on file  Other Topics Concern  . Not on file  Social History Narrative  . Not on file    Has this patient used any form of tobacco in the last 30 days? (Cigarettes, Smokeless Tobacco, Cigars, and/or Pipes) A prescription for an FDA-approved tobacco cessation medication was offered at discharge and the patient refused  Current Medications: Current Facility-Administered Medications  Medication Dose Route Frequency Provider Last Rate Last Dose  . busPIRone (BUSPAR) tablet 10 mg  10 mg Oral BID Antonieta Pert, MD      . citalopram (CELEXA) tablet 10 mg  10 mg Oral Daily Antonieta Pert, MD      . divalproex (DEPAKOTE) DR tablet 250 mg  250 mg  Oral BID Antonieta Pert, MD   250 mg at 08/07/19 1013  . hydrOXYzine (ATARAX/VISTARIL) tablet 25 mg  25 mg Oral Q6H PRN Azariah Bonura B, NP      . loperamide (IMODIUM) capsule 2-4 mg  2-4 mg Oral PRN Evren Shankland B, NP      . LORazepam (ATIVAN) tablet 1 mg  1 mg Oral Q6H PRN Cyara Devoto B, NP      . multivitamin with minerals tablet 1 tablet  1 tablet Oral Daily Jashay Roddy B, NP   1 tablet at 08/07/19 0800  . nicotine (NICODERM CQ - dosed in mg/24 hours) patch 21 mg  21 mg Transdermal Daily Cobos, Rockey Situ, MD   21 mg at 08/07/19 0801  . ondansetron (ZOFRAN-ODT) disintegrating tablet 4 mg  4 mg Oral Q6H PRN Sharnae Winfree B, NP      . thiamine (VITAMIN B-1) tablet 100 mg  100 mg Oral Daily Mohamedamin Nifong B, NP   100 mg at 08/07/19 0800  . traZODone (DESYREL) tablet 50 mg  50 mg Oral QHS PRN Yannely Kintzel B, NP       PTA Medications: Medications Prior to Admission  Medication Sig Dispense Refill Last Dose  . clonazePAM (KLONOPIN) 0.5 MG tablet Take 0.75 mg by mouth 2 (two) times daily.     . busPIRone (BUSPAR) 15 MG tablet Take 1 tablet (15 mg total) by mouth 3 (three) times daily. (Patient not taking: Reported on 08/06/2019) 90 tablet 1 Not Taking at Unknown time  . traZODone (DESYREL) 50 MG tablet Take 1 tablet (50 mg total) by mouth at bedtime as needed for sleep. (Patient not taking: Reported on 08/04/2019) 30 tablet 0 Not Taking at Unknown time    Musculoskeletal: Strength & Muscle Tone: within normal limits Gait & Station: normal Patient leans: N/A  Psychiatric Specialty Exam: Physical Exam  Nursing note and vitals reviewed. Constitutional: He is oriented to person, place, and time. He appears well-nourished. No distress.  Neck: Normal range of motion.  Musculoskeletal: Normal range of motion.  Neurological: He is oriented to person, place, and time.  Skin: Skin is warm and dry.  Psychiatric: He has a normal mood and affect. His speech is normal and behavior is  normal. Judgment and thought content normal. Cognition and memory are normal.    Review of Systems  Psychiatric/Behavioral: Positive for substance abuse. Depression: Stable. Hallucinations: denies. Memory loss: Denies. Suicidal ideas: Denies. Nervous/anxious: Stable. Insomnia: Denies.   All other systems reviewed and are negative.   Blood pressure 111/80, pulse 76, temperature 98.1 F (36.7 C), temperature source Oral, resp. rate 20, SpO2 99 %.There is no height or weight on file to calculate BMI.  General Appearance: Casual  Eye Contact:  Good  Speech:  Clear and Coherent and Normal Rate  Volume:  Normal  Mood:  "Good" appropriate  Affect:  Appropriate and Congruent  Thought Process:  Coherent, Goal Directed and Descriptions of Associations: Intact  Orientation:  Full (Time, Place, and Person)  Thought Content:  WDL  Suicidal Thoughts:  No  Homicidal Thoughts:  No  Memory:  Immediate;   Good Recent;   Good  Judgement:  Intact  Insight:  Present  Psychomotor Activity:  Normal  Concentration:  Concentration: Good and Attention Span: Good  Recall:  Good  Fund of Knowledge:  Good  Language:  Good  Akathisia:  No  Handed:  Right  AIMS (if indicated):     Assets:  Communication Skills Desire for Improvement Social Support  ADL's:  Intact  Cognition:  WNL  Sleep:        Demographic Factors:  Male  Loss Factors: Financial problems/change in socioeconomic status  Historical Factors: NA  Risk Reduction Factors:   Religious beliefs about death, Living with another person, especially a relative and Positive social support  Continued Clinical Symptoms:  Alcohol/Substance Abuse/Dependencies Previous Psychiatric Diagnoses and Treatments  Cognitive Features That Contribute To Risk:  None    Suicide Risk:  Minimal: No identifiable suicidal ideation.  Patients presenting with no risk factors but with morbid ruminations; may be classified as minimal risk based on the  severity of the depressive symptoms      Medication List    START taking these medications   . divalproex 250 MG DR tablet; Commonly known as: DEPAKOTE; Take 1 tablet  (250 mg total) by mouth 2 (two) times daily.   STOP taking these medications   . busPIRone 15 MG tablet; Commonly known as: BUSPAR . clonazePAM 0.5 MG tablet; Commonly known as: KLONOPIN . traZODone 50 MG tablet; Commonly known as: DESYREL  Patient no longer need Seroquel.  Was not restarted.    Plan Of Care/Follow-up recommendations:  Activity:  As tolerated Diet:  Heart healthy   Discharge Instructions     To help you maintain a sober lifestyle, a substance abuse treatment program may be beneficial to you.  You are scheduled for a screening appointment at the [facility] residential facility on Thursday, August 08, 2019 at 7:45 am:       [facility]      [address] [place], [place] [address]      [phone]     Disposition:  Patient psychiatrically cleared No evidence of imminent risk to self or others at present.   Patient does not meet criteria for psychiatric inpatient admission. Supportive therapy provided about ongoing stressors. Discussed crisis plan, support from social network, calling 911, coming to the Emergency Department, and calling Suicide Hotline.  Marcia Lepera, NP 08/07/2019, 2:03 PM

## 2019-08-07 NOTE — BH Assessment (Signed)
[employer] Assessment Progress Note  Per Shuvon Rankin, FNP, this pt does not require psychiatric hospitalization at this time.  Pt presents under IVC initiated by EDP Lacretia Leigh, MD, which has been rescinded by Myles Lipps, MD.  Pt has been seen by Peer Support, and arrangements have been made to pt to be transferred to the [facility] residential treatment facility on [address].  Pt is to arrive tomorrow, 08/08/2019 at 07:45, which this writer has entered into pt's discharge instructions.  Pt agrees to remain at the [facility] Observation Unit overnight for transport tomorrow morning.  Per June at [facility], pt must have a month's supply of any standing medication he will need, as well as a prescription for refills for another month.  This has been discussed with Shuvon.  Pt's nurse, Jan, has been notified.  Jalene Mullet, [place] Triage Specialist [phone]

## 2019-08-08 ENCOUNTER — Encounter (HOSPITAL_COMMUNITY): Payer: Self-pay | Admitting: Behavioral Health

## 2019-11-01 ENCOUNTER — Encounter (HOSPITAL_COMMUNITY): Payer: Self-pay

## 2019-11-01 ENCOUNTER — Emergency Department (HOSPITAL_COMMUNITY)
Admission: EM | Admit: 2019-11-01 | Discharge: 2019-11-01 | Disposition: A | Discharge status: Home / Self Care | Payer: Medicaid Other | Attending: Emergency Medicine | Admitting: Emergency Medicine

## 2019-11-01 ENCOUNTER — Other Ambulatory Visit: Payer: Self-pay

## 2019-11-01 DIAGNOSIS — F1010 Alcohol abuse, uncomplicated: Secondary | ICD-10-CM | POA: Diagnosis not present

## 2019-11-01 DIAGNOSIS — F17210 Nicotine dependence, cigarettes, uncomplicated: Secondary | ICD-10-CM | POA: Diagnosis not present

## 2019-11-01 DIAGNOSIS — R410 Disorientation, unspecified: Secondary | ICD-10-CM | POA: Diagnosis present

## 2019-11-01 DIAGNOSIS — Z79899 Other long term (current) drug therapy: Secondary | ICD-10-CM | POA: Diagnosis not present

## 2019-11-01 DIAGNOSIS — R4182 Altered mental status, unspecified: Secondary | ICD-10-CM

## 2019-11-01 LAB — COMPREHENSIVE METABOLIC PANEL
ALT: 89 U/L — ABNORMAL HIGH (ref 0–44)
AST: 50 U/L — ABNORMAL HIGH (ref 15–41)
Albumin: 4.6 g/dL (ref 3.5–5.0)
Alkaline Phosphatase: 53 U/L (ref 38–126)
Anion gap: 13 (ref 5–15)
BUN: 12 mg/dL (ref 6–20)
CO2: 24 mmol/L (ref 22–32)
Calcium: 9.3 mg/dL (ref 8.9–10.3)
Chloride: 105 mmol/L (ref 98–111)
Creatinine, Ser: 0.97 mg/dL (ref 0.61–1.24)
GFR calc Af Amer: >60 mL/min (ref >60)
GFR calc non Af Amer: >60 mL/min (ref >60)
Glucose, Bld: 97 mg/dL (ref 70–99)
Potassium: 3.4 mmol/L — ABNORMAL LOW (ref 3.5–5.1)
Sodium: 142 mmol/L (ref 135–145)
Total Bilirubin: 0.5 mg/dL (ref 0.3–1.2)
Total Protein: 8.2 g/dL — ABNORMAL HIGH (ref 6.5–8.1)

## 2019-11-01 LAB — CBC WITH DIFFERENTIAL/PLATELET
Abs Immature Granulocytes: 0.08 10*3/uL — ABNORMAL HIGH (ref 0.00–0.07)
Basophils Absolute: 0 10*3/uL (ref 0.0–0.1)
Basophils Relative: 0 %
Eosinophils Absolute: 0 10*3/uL (ref 0.0–0.5)
Eosinophils Relative: 0 %
HCT: 49.3 % (ref 39.0–52.0)
Hemoglobin: 16.6 g/dL (ref 13.0–17.0)
Immature Granulocytes: 1 %
Lymphocytes Relative: 37 %
Lymphs Abs: 5.2 10*3/uL — ABNORMAL HIGH (ref 0.7–4.0)
MCH: 31.7 pg (ref 26.0–34.0)
MCHC: 33.7 g/dL (ref 30.0–36.0)
MCV: 94.1 fL (ref 80.0–100.0)
Monocytes Absolute: 0.8 10*3/uL (ref 0.1–1.0)
Monocytes Relative: 6 %
Neutro Abs: 8.1 10*3/uL — ABNORMAL HIGH (ref 1.7–7.7)
Neutrophils Relative %: 56 %
Platelets: 222 10*3/uL (ref 150–400)
RBC: 5.24 MIL/uL (ref 4.22–5.81)
RDW: 12.4 % (ref 11.5–15.5)
WBC: 14.2 10*3/uL — ABNORMAL HIGH (ref 4.0–10.5)
nRBC: 0 % (ref 0.0–0.2)

## 2019-11-01 LAB — ACETAMINOPHEN LEVEL: Acetaminophen (Tylenol), Serum: <10 ug/mL — ABNORMAL LOW (ref 10–30)

## 2019-11-01 LAB — ETHANOL: Alcohol, Ethyl (B): 114 mg/dL — ABNORMAL HIGH (ref <10)

## 2019-11-01 LAB — SALICYLATE LEVEL: Salicylate Lvl: <7 mg/dL — ABNORMAL LOW (ref 7.0–30.0)

## 2019-11-01 LAB — LIPASE, BLOOD: Lipase: 28 U/L (ref 11–51)

## 2019-11-01 MED ORDER — ALUM & MAG HYDROXIDE-SIMETH 200-200-20 MG/5ML PO SUSP
30.0000 mL | Freq: Once | ORAL | Status: AC
  Administered 2019-11-01: 30 mL via ORAL
  Filled 2019-11-01: qty 30

## 2019-11-01 NOTE — ED Triage Notes (Signed)
Pt presents via ems. Pt called 911 for a medication refill. When ems arrived to patient patient did not remember how he got there. Pt appears to ER being confused. Pt is rambling and belligerent. Pt is distrustful of staff. Pt is otherwise well appearing.

## 2019-11-01 NOTE — ED Notes (Signed)
Pt refused DC paperwork. Pt also cursing at staff, calling staff names, and yelling at staff while staff was walking pt out. Pt was provided a bus pass. Pt refused vitals.

## 2019-11-01 NOTE — ED Provider Notes (Signed)
[place] DEPT Provider Note   CSN: [id number] Arrival date & time: 11/01/19  0351     History Chief Complaint  Patient presents with  . Medical Clearance    Fernando [name] is a 44 y.o. male with a hx of alcohol dependence, bipolar disorder, schizophrenia presents to the Emergency Department via EMS.  EMS reports that patient initially called them for medication refill but upon their arrival he seemed confused and did not know how he had gotten to where he was, thus he was brought here to the emergency department.  Patient endorses alcohol and drug usage tonight.  He reports he has had "a lot."  To drink and smoked marijuana but he does not know what else he ingested.  Patient is alert to person and time.  He knows he is in a hospital, but does not know which one.  Patient reports he is anxious but denies other physical symptoms.  He reports he cannot remember calling EMS.  He denies falling or hitting his head.  Records reviewed.  Patient seen several times in 2014 and 2015, then not again until 2020 when he was admitted for suicidal ideations and auditory hallucinations.  Patient denies both of these tonight.  He reports he has been off his medications for some time.  No specific aggravating or alleviating factors.  No treatments prior to arrival.  The history is provided by the patient and medical records. No language interpreter was used.       Past Medical History:  Diagnosis Date  . Appendicitis   . Bipolar 1 disorder ([place])   . Collar bone fracture   . Fx ankle   . Hepatitis C   . Schizophrenia [facility])     Patient Active Problem List   Diagnosis Date Noted  . MDD (major depressive disorder), severe ([place]) 08/05/2019  . Substance induced mood disorder ([place]) 02/23/2014  . Polysubstance abuse ([place]) 02/23/2014  . Substance abuse ([place]) 02/20/2014  . Injury 11/03/2013  . Alcohol dependence ([place]) 10/31/2013  . Unspecified episodic mood disorder  10/31/2013  . GAD (generalized anxiety disorder) 10/31/2013  . Alcohol abuse 10/30/2013  . MDD (major depressive disorder), recurrent episode, severe ([place]) 10/30/2013  . 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 Cocaine dependence, abuse ([place]) 06/14/2013  . Homelessness 06/14/2013    Past Surgical History:  Procedure Laterality Date  . ANKLE FRACTURE SURGERY Left   . APPENDECTOMY         No family history on file.  Social History   Tobacco Use  . Smoking status: Current Every Day Smoker    Packs/day: 1.00    Years: 19.00    Pack years: 19.00    Types: Cigarettes  . Smokeless tobacco: Never Used  Substance Use Topics  . Alcohol use: Yes    Alcohol/week: 7.0 standard drinks    Types: 7 Cans of beer per week  . Drug use: Yes    Types: Cocaine, Marijuana    Home Medications Prior to Admission medications   Medication Sig Start Date End Date Taking? Authorizing Provider  divalproex (DEPAKOTE) 250 MG DR tablet Take 1 tablet (250 mg total) by mouth 2 (two) times daily. 08/07/19  Yes Rankin, Shuvon B, NP    Allergies    Patient has no known allergies.  Review of Systems   Review of Systems  Constitutional: Negative for appetite change, diaphoresis, fatigue, fever and unexpected weight change.  HENT: Negative for mouth sores.   Eyes:  Negative for visual disturbance.  Respiratory: Negative for cough, chest tightness, shortness of breath and wheezing.   Cardiovascular: Negative for chest pain.  Gastrointestinal: Negative for abdominal pain, constipation, diarrhea, nausea and vomiting.  Endocrine: Negative for polydipsia, polyphagia and polyuria.  Genitourinary: Negative for dysuria, frequency, hematuria and urgency.  Musculoskeletal: Negative for back pain and neck stiffness.  Skin: Negative for rash.  Allergic/Immunologic: Negative for immunocompromised state.  Neurological: Negative for syncope, light-headedness and headaches.    Hematological: Does not bruise/bleed easily.  Psychiatric/Behavioral: Positive for agitation. Negative for sleep disturbance. The patient is nervous/anxious.     Physical Exam Updated Vital Signs BP (!) 143/94   Pulse 95   Temp 97.6 F (36.4 C) (Oral)   Resp 20   Ht 5\' 8"  (1.727 m)   Wt 79.4 kg   SpO2 100%   BMI 26.61 kg/m   Physical Exam Vitals and nursing note reviewed.  Constitutional:      General: He is not in acute distress.    Appearance: He is not diaphoretic.     Comments: Smell of alcohol about patient  HENT:     Head: Normocephalic.  Eyes:     General: No scleral icterus.    Conjunctiva/sclera: Conjunctivae normal.  Cardiovascular:     Rate and Rhythm: Normal rate and regular rhythm.     Pulses: Normal pulses.          Radial pulses are 2+ on the right side and 2+ on the left side.  Pulmonary:     Effort: No tachypnea, accessory muscle usage, prolonged expiration, respiratory distress or retractions.     Breath sounds: No stridor.     Comments: Equal chest rise. No increased work of breathing. Abdominal:     General: There is no distension.     Palpations: Abdomen is soft.     Tenderness: There is no abdominal tenderness. There is no guarding or rebound.  Musculoskeletal:     Cervical back: Normal range of motion.     Comments: Moves all extremities equally and without difficulty.  Skin:    General: Skin is warm and dry.     Capillary Refill: Capillary refill takes less than 2 seconds.  Neurological:     Mental Status: He is alert.     GCS: GCS eye subscore is 4. GCS verbal subscore is 5. GCS motor subscore is 6.     Comments: Speech is clear and goal oriented, though patient appears intoxicated. Patient oriented to person and time.  He does know he is in a hospital but does not know which one. Ambulates without assistance with unsteady gait but no ataxia.  Psychiatric:        Mood and Affect: Affect is labile and tearful.        Behavior: Behavior  is agitated.     ED Results / Procedures / Treatments   Labs (all labs ordered are listed, but only abnormal results are displayed) Labs Reviewed  COMPREHENSIVE METABOLIC PANEL - Abnormal; Notable for the following components:      Result Value   Potassium 3.4 (*)    Total Protein 8.2 (*)    AST 50 (*)    ALT 89 (*)    All other components within normal limits  ETHANOL - Abnormal; Notable for the following components:   Alcohol, Ethyl (B) 114 (*)    All other components within normal limits  CBC WITH DIFFERENTIAL/PLATELET - Abnormal; Notable for the following components:   WBC  14.2 (*)    Neutro Abs 8.1 (*)    Lymphs Abs 5.2 (*)    Abs Immature Granulocytes 0.08 (*)    All other components within normal limits  SALICYLATE LEVEL - Abnormal; Notable for the following components:   Salicylate Lvl <7.0 (*)    All other components within normal limits  ACETAMINOPHEN LEVEL - Abnormal; Notable for the following components:   Acetaminophen (Tylenol), Serum <10 (*)    All other components within normal limits  RAPID URINE DRUG SCREEN, HOSP PERFORMED  URINALYSIS, ROUTINE W REFLEX MICROSCOPIC  LIPASE, BLOOD    Procedures Procedures (including critical care time)  Medications Ordered in ED Medications - No data to display  ED Course  I have reviewed the triage vital signs and the nursing notes.  Pertinent labs & imaging results that were available during my care of the patient were reviewed by me and considered in my medical decision making (see chart for details).    MDM Rules/Calculators/A&P                       The patient was discussed with and seen by Dr. Eudelia Bunch who agrees with the treatment plan.  Patient with a history of alcohol abuse presents emergency department.  He appears intoxicated.  He is largely oriented but states he cannot remember what happened.  No physical evidence of trauma.  Patient initially uncooperative however does settle down and allows his  blood to be drawn.  6:03 AM Patient sleeping.  Mild hypokalemia noted along with leukocytosis.  AST and ALT are elevated however this would be expected in setting of alcoholism.  Abdomen was soft and nontender on exam.  Other labs pending.  Patient will be signed out to oncoming provider for reassessment after he has sobered  6:38 AM At shift change care was transferred to [facility], PA-C who will follow pending studies, re-evaulate and determine disposition.    Fernando [name] was evaluated in Emergency Department on 11/01/2019 for the symptoms described in the history of present illness. He was evaluated in the context of the global COVID-19 pandemic, which necessitated consideration that the patient might be at risk for infection with the SARS-CoV-2 virus that causes COVID-19. Institutional protocols and algorithms that pertain to the evaluation of patients at risk for COVID-19 are in a state of rapid change based on information released by regulatory bodies including the CDC and federal and state organizations. These policies and algorithms were followed during the patient's care in the ED.     Final Clinical Impression(s) / ED Diagnoses Final diagnoses:  Alcohol abuse  Altered mental status, unspecified altered mental status type    Rx / DC Orders ED Discharge Orders    None       Nafeesah Lapaglia, Boyd Kerbs 11/01/19 0300    Nira Conn, MD 11/01/19 [phone]

## 2019-11-01 NOTE — ED Notes (Signed)
Pt refused to talk to TTS. Pt states " [name] got me fucking crazy. Leave me alone. I dont want to talk to no body. I am going to go home in a minute. Just leave me alone" PA made aware.

## 2019-11-01 NOTE — ED Provider Notes (Signed)
Care transferred from Fraser, PA-C at shift change. See note for full HPI.  In summation,   "Fernando [name] is a 44 y.o. male with a hx of alcohol dependence, bipolar disorder, schizophrenia presents to the Emergency Department via EMS.  EMS reports that patient initially called them for medication refill but upon their arrival he seemed confused and did not know how he had gotten to where he was, thus he was brought here to the emergency department.  Patient endorses alcohol and drug usage tonight.  He reports he has had "a lot."  To drink and smoked marijuana but he does not know what else he ingested.  Patient is alert to person and time.  He knows he is in a hospital, but does not know which one.  Patient reports he is anxious but denies other physical symptoms.  He reports he cannot remember calling EMS.  He denies falling or hitting his head.  Records reviewed.  Patient seen several times in 2014 and 2015, then not again until 2020 when he was admitted for suicidal ideations and auditory hallucinations.  Patient denies both of these tonight.  He reports he has been off his medications for some time.  No specific aggravating or alleviating factors.  No treatments prior to arrival."  Labs personally reviewed an interpreted. Elevated LFTs however expected in setting of alcoholism. Plan to reassess and metabolize to freedom. Physical Exam  BP (!) 150/70   Pulse 68   Temp 97.6 F (36.4 C) (Oral)   Resp 16   Ht 5\' 8"  (1.727 m)   Wt 79.4 kg   SpO2 97%   BMI 26.61 kg/m   Physical Exam Vitals and nursing note reviewed.  Constitutional:      General: He is not in acute distress.    Appearance: He is well-developed. He is not ill-appearing, toxic-appearing or diaphoretic.     Comments: Sleeping arousal to voice  HENT:     Head: Normocephalic and atraumatic.     Nose: Nose normal.     Mouth/Throat:     Mouth: Mucous membranes are moist.     Pharynx: Oropharynx is clear.  Eyes:   Pupils: Pupils are equal, round, and reactive to light.  Cardiovascular:     Rate and Rhythm: Normal rate and regular rhythm.     Pulses: Normal pulses.     Heart sounds: Normal heart sounds.  Pulmonary:     Effort: Pulmonary effort is normal. No respiratory distress.     Breath sounds: Normal breath sounds.  Abdominal:     General: Bowel sounds are normal. There is no distension.     Palpations: Abdomen is soft.  Musculoskeletal:        General: Normal range of motion.     Cervical back: Normal range of motion and neck supple.  Skin:    General: Skin is warm and dry.     Capillary Refill: Capillary refill takes less than 2 seconds.  Neurological:     Mental Status: He is alert.     ED Course/Procedures     Procedures Labs Reviewed  COMPREHENSIVE METABOLIC PANEL - Abnormal; Notable for the following components:      Result Value   Potassium 3.4 (*)    Total Protein 8.2 (*)    AST 50 (*)    ALT 89 (*)    All other components within normal limits  ETHANOL - Abnormal; Notable for the following components:   Alcohol, Ethyl (B) 114 (*)  All other components within normal limits  CBC WITH DIFFERENTIAL/PLATELET - Abnormal; Notable for the following components:   WBC 14.2 (*)    Neutro Abs 8.1 (*)    Lymphs Abs 5.2 (*)    Abs Immature Granulocytes 0.08 (*)    All other components within normal limits  SALICYLATE LEVEL - Abnormal; Notable for the following components:   Salicylate Lvl <7.0 (*)    All other components within normal limits  ACETAMINOPHEN LEVEL - Abnormal; Notable for the following components:   Acetaminophen (Tylenol), Serum <10 (*)    All other components within normal limits  LIPASE, BLOOD  RAPID URINE DRUG SCREEN, HOSP PERFORMED  URINALYSIS, ROUTINE W REFLEX MICROSCOPIC  No results found. MDM  44 year old presents for evaluation of EtOh intoxication. Denies SI. HI,AVH. Hx of alcohol abuse and called EMS for a medication refill. Found to be intoxicated.  No evidence of WD at this time. He has not complaints at this time. Will plan to metabolize to freedom. Does not need TTS consult.  0815: Patient reassessed. Continues to be sleepy. No complaints other than the lights being on in his room. Will continue to metabolize and reassess.  0945: Reassessed.  He is ambulatory in room and tolerating p.o. intake.  Denies SI, HI, AVH.  Patient admits to alcohol use yesterday evening.  Patient states "I was just drunk."  Does not want resource for alcohol use. States he does not plan to quit. Does not appear actively psychotic, hallucinating or appear to be a threat to himself or others. As I was walking into another patients room nursing notified me that patient walked out of the emergency department prior to official dc however he denies.  He did not have any IVs placed.      Chayson Charters A, PA-C 11/01/19 [id number]    Rolan Bucco, MD 11/01/19 [id number]

## 2020-03-11 ENCOUNTER — Encounter (HOSPITAL_COMMUNITY): Payer: Self-pay

## 2020-03-11 ENCOUNTER — Other Ambulatory Visit: Payer: Self-pay

## 2020-03-11 ENCOUNTER — Emergency Department (HOSPITAL_COMMUNITY): Payer: Medicaid Other

## 2020-03-11 ENCOUNTER — Emergency Department (HOSPITAL_COMMUNITY)
Admission: EM | Admit: 2020-03-11 | Discharge: 2020-03-12 | Disposition: A | Discharge status: Home / Self Care | Payer: Medicaid Other | Source: Home / Self Care | Attending: Emergency Medicine | Admitting: Emergency Medicine

## 2020-03-11 ENCOUNTER — Encounter (HOSPITAL_COMMUNITY): Payer: Self-pay | Admitting: Emergency Medicine

## 2020-03-11 ENCOUNTER — Ambulatory Visit (HOSPITAL_COMMUNITY)
Admission: EM | Admit: 2020-03-11 | Discharge: 2020-03-11 | Disposition: A | Discharge status: Home / Self Care | Payer: Medicaid Other | Source: Home / Self Care

## 2020-03-11 ENCOUNTER — Emergency Department (HOSPITAL_COMMUNITY)
Admission: EM | Admit: 2020-03-11 | Discharge: 2020-03-11 | Disposition: A | Discharge status: Home / Self Care | Payer: Medicaid Other | Attending: Emergency Medicine | Admitting: Emergency Medicine

## 2020-03-11 DIAGNOSIS — F1020 Alcohol dependence, uncomplicated: Secondary | ICD-10-CM

## 2020-03-11 DIAGNOSIS — Z20822 Contact with and (suspected) exposure to covid-19: Secondary | ICD-10-CM | POA: Insufficient documentation

## 2020-03-11 DIAGNOSIS — Z590 Homelessness: Secondary | ICD-10-CM | POA: Insufficient documentation

## 2020-03-11 DIAGNOSIS — F17210 Nicotine dependence, cigarettes, uncomplicated: Secondary | ICD-10-CM | POA: Insufficient documentation

## 2020-03-11 DIAGNOSIS — Y999 Unspecified external cause status: Secondary | ICD-10-CM | POA: Insufficient documentation

## 2020-03-11 DIAGNOSIS — R45851 Suicidal ideations: Secondary | ICD-10-CM | POA: Insufficient documentation

## 2020-03-11 DIAGNOSIS — Y939 Activity, unspecified: Secondary | ICD-10-CM | POA: Insufficient documentation

## 2020-03-11 DIAGNOSIS — F918 Other conduct disorders: Secondary | ICD-10-CM | POA: Insufficient documentation

## 2020-03-11 DIAGNOSIS — F332 Major depressive disorder, recurrent severe without psychotic features: Secondary | ICD-10-CM

## 2020-03-11 DIAGNOSIS — Y929 Unspecified place or not applicable: Secondary | ICD-10-CM | POA: Insufficient documentation

## 2020-03-11 DIAGNOSIS — Z5321 Procedure and treatment not carried out due to patient leaving prior to being seen by health care provider: Secondary | ICD-10-CM | POA: Insufficient documentation

## 2020-03-11 DIAGNOSIS — F410 Panic disorder [episodic paroxysmal anxiety] without agoraphobia: Secondary | ICD-10-CM | POA: Insufficient documentation

## 2020-03-11 DIAGNOSIS — S20219A Contusion of unspecified front wall of thorax, initial encounter: Secondary | ICD-10-CM | POA: Diagnosis not present

## 2020-03-11 DIAGNOSIS — R002 Palpitations: Secondary | ICD-10-CM | POA: Insufficient documentation

## 2020-03-11 DIAGNOSIS — F1420 Cocaine dependence, uncomplicated: Secondary | ICD-10-CM

## 2020-03-11 DIAGNOSIS — Z79899 Other long term (current) drug therapy: Secondary | ICD-10-CM | POA: Insufficient documentation

## 2020-03-11 DIAGNOSIS — F919 Conduct disorder, unspecified: Secondary | ICD-10-CM

## 2020-03-11 DIAGNOSIS — S299XXA Unspecified injury of thorax, initial encounter: Secondary | ICD-10-CM | POA: Diagnosis present

## 2020-03-11 DIAGNOSIS — R4689 Other symptoms and signs involving appearance and behavior: Secondary | ICD-10-CM

## 2020-03-11 LAB — POCT URINE DRUG SCREEN - MANUAL ENTRY (I-SCREEN)
POC Amphetamine UR: NOT DETECTED
POC Buprenorphine (BUP): NOT DETECTED
POC Cocaine UR: NOT DETECTED
POC Marijuana UR: POSITIVE — AB
POC Methadone UR: NOT DETECTED
POC Methamphetamine UR: POSITIVE — AB
POC Morphine: NOT DETECTED
POC Oxazepam (BZO): NOT DETECTED
POC Oxycodone UR: NOT DETECTED
POC Secobarbital (BAR): NOT DETECTED

## 2020-03-11 LAB — LIPID PANEL
Cholesterol: 205 mg/dL — ABNORMAL HIGH (ref 0–200)
HDL: 54 mg/dL (ref >40)
LDL Cholesterol: 119 mg/dL — ABNORMAL HIGH (ref 0–99)
Total CHOL/HDL Ratio: 3.8 RATIO
Triglycerides: 159 mg/dL — ABNORMAL HIGH (ref <150)
VLDL: 32 mg/dL (ref 0–40)

## 2020-03-11 LAB — POCT URINALYSIS DIP (DEVICE)
Glucose, UA: NEGATIVE mg/dL
Ketones, ur: NEGATIVE mg/dL
Leukocytes,Ua: NEGATIVE
Nitrite: NEGATIVE
Protein, ur: 30 mg/dL — AB
Specific Gravity, Urine: >=1.03 (ref 1.005–1.030)
Urobilinogen, UA: 0.2 mg/dL (ref 0.0–1.0)
pH: 5.5 (ref 5.0–8.0)

## 2020-03-11 LAB — COMPREHENSIVE METABOLIC PANEL
ALT: 122 U/L — ABNORMAL HIGH (ref 0–44)
AST: 95 U/L — ABNORMAL HIGH (ref 15–41)
Albumin: 4.1 g/dL (ref 3.5–5.0)
Alkaline Phosphatase: 61 U/L (ref 38–126)
Anion gap: 14 (ref 5–15)
BUN: 12 mg/dL (ref 6–20)
CO2: 25 mmol/L (ref 22–32)
Calcium: 9.5 mg/dL (ref 8.9–10.3)
Chloride: 103 mmol/L (ref 98–111)
Creatinine, Ser: 0.87 mg/dL (ref 0.61–1.24)
GFR calc Af Amer: >60 mL/min (ref >60)
GFR calc non Af Amer: >60 mL/min (ref >60)
Glucose, Bld: 96 mg/dL (ref 70–99)
Potassium: 3.4 mmol/L — ABNORMAL LOW (ref 3.5–5.1)
Sodium: 142 mmol/L (ref 135–145)
Total Bilirubin: 0.5 mg/dL (ref 0.3–1.2)
Total Protein: 7.4 g/dL (ref 6.5–8.1)

## 2020-03-11 LAB — CBC
HCT: 46.2 % (ref 39.0–52.0)
Hemoglobin: 15.6 g/dL (ref 13.0–17.0)
MCH: 31.8 pg (ref 26.0–34.0)
MCHC: 33.8 g/dL (ref 30.0–36.0)
MCV: 94.1 fL (ref 80.0–100.0)
Platelets: 209 10*3/uL (ref 150–400)
RBC: 4.91 MIL/uL (ref 4.22–5.81)
RDW: 11.9 % (ref 11.5–15.5)
WBC: 9.8 10*3/uL (ref 4.0–10.5)
nRBC: 0 % (ref 0.0–0.2)

## 2020-03-11 LAB — RAPID URINE DRUG SCREEN, HOSP PERFORMED
Amphetamines: NOT DETECTED
Barbiturates: NOT DETECTED
Benzodiazepines: NOT DETECTED
Cocaine: POSITIVE — AB
Opiates: NOT DETECTED
Tetrahydrocannabinol: POSITIVE — AB

## 2020-03-11 LAB — TSH: TSH: 1.919 u[IU]/mL (ref 0.350–4.500)

## 2020-03-11 LAB — ETHANOL: Alcohol, Ethyl (B): 13 mg/dL — ABNORMAL HIGH (ref <10)

## 2020-03-11 LAB — VALPROIC ACID LEVEL: Valproic Acid Lvl: <10 ug/mL — ABNORMAL LOW (ref 50.0–100.0)

## 2020-03-11 LAB — POC SARS CORONAVIRUS 2 AG: SARS Coronavirus 2 Ag: NEGATIVE

## 2020-03-11 MED ORDER — ONDANSETRON 4 MG PO TBDP: 4.0000 mg | ORAL_TABLET | Freq: Four times a day (QID) | ORAL | Status: DC | PRN

## 2020-03-11 MED ORDER — LORAZEPAM 1 MG PO TABS: 1.0000 mg | ORAL_TABLET | Freq: Four times a day (QID) | ORAL | Status: DC | PRN

## 2020-03-11 MED ORDER — THIAMINE HCL 100 MG PO TABS: 100.0000 mg | ORAL_TABLET | Freq: Every day | ORAL | Status: DC

## 2020-03-11 MED ORDER — HYDROXYZINE HCL 25 MG PO TABS: 25.0000 mg | ORAL_TABLET | Freq: Three times a day (TID) | ORAL | Status: DC | PRN

## 2020-03-11 MED ORDER — ACETAMINOPHEN 325 MG PO TABS: 650.0000 mg | ORAL_TABLET | Freq: Four times a day (QID) | ORAL | Status: DC | PRN

## 2020-03-11 MED ORDER — LOPERAMIDE HCL 2 MG PO CAPS: 2.0000 mg | ORAL_CAPSULE | ORAL | Status: DC | PRN

## 2020-03-11 MED ORDER — ALUM & MAG HYDROXIDE-SIMETH 200-200-20 MG/5ML PO SUSP: 30.0000 mL | ORAL | Status: DC | PRN

## 2020-03-11 MED ORDER — ADULT MULTIVITAMIN W/MINERALS CH
1.0000 | ORAL_TABLET | Freq: Every day | ORAL | Status: DC
  Administered 2020-03-11: 1 via ORAL
  Filled 2020-03-11: qty 1

## 2020-03-11 MED ORDER — TRAZODONE HCL 50 MG PO TABS: 50.0000 mg | ORAL_TABLET | Freq: Every evening | ORAL | Status: DC | PRN

## 2020-03-11 MED ORDER — THIAMINE HCL 100 MG/ML IJ SOLN
100.0000 mg | Freq: Once | INTRAMUSCULAR | Status: AC
  Administered 2020-03-11: 100 mg via INTRAMUSCULAR
  Filled 2020-03-11: qty 2

## 2020-03-11 MED ORDER — MAGNESIUM HYDROXIDE 400 MG/5ML PO SUSP: 30.0000 mL | Freq: Every day | ORAL | Status: DC | PRN

## 2020-03-11 MED ORDER — HYDROXYZINE HCL 25 MG PO TABS: 25.0000 mg | ORAL_TABLET | Freq: Four times a day (QID) | ORAL | Status: DC | PRN
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
## 2020-03-11 NOTE — ED Notes (Signed)
Patient alert and engaging with others.  Patient made several phone calls. Patient interacting with staff and others

## 2020-03-11 NOTE — Discharge Instructions (Addendum)
Return if symptoms worsen Schedule appoint for outpatient psychiatry
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
## 2020-03-11 NOTE — ED Notes (Signed)
Pt asleep with even and unlabored respirations. No distress or discomfort noted. Pt remains safe on the unit. 

## 2020-03-11 NOTE — ED Notes (Addendum)
This Clinical research associate spoke to the patient in the lobby and explained to him the process of the ED, he stated that it was ridiculous that he had to wait when he was hurting
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
## 2020-03-11 NOTE — ED Notes (Signed)
Pt discharged in no acute distress. Pt given bus pass for transport to hotel funded by his mother. Verbalized understanding of all discharge instructions. All belongings returned to pt to include wallet and cellphone and a pair of white sneakers. Safety maintained. Denied withdrawal sx at present.

## 2020-03-11 NOTE — ED Notes (Signed)
Pt alert and agitated. Pt stating, "I'm ready to go home. If you won't give me no Ativan, then I'm going home. Every time I go to a hospital, they give me Ativan. That's why I'm here. MD notified of pt request. Will continue to monitor. Pt eating snack at current.

## 2020-03-11 NOTE — ED Notes (Signed)
Patient just arrived to the unit. Patient received a skin test. Patient was cooperative and reported being very tired. Patient is in flex one.
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
## 2020-03-11 NOTE — ED Notes (Signed)
Pt admitted to observation unit, alert, oriented, and ambulating with no issues. Pt is cooperative and pleasant, denies SI/HI, AVH. Pt is resting with no signs of distress or discomfort. Education, support and encouragement provided. Will continue to monitor. Pt remains safe on the unit.
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
## 2020-03-12 ENCOUNTER — Other Ambulatory Visit: Payer: Self-pay

## 2020-03-12 LAB — COMPREHENSIVE METABOLIC PANEL
ALT: 97 U/L — ABNORMAL HIGH (ref 0–44)
AST: 70 U/L — ABNORMAL HIGH (ref 15–41)
Albumin: 3.8 g/dL (ref 3.5–5.0)
Alkaline Phosphatase: 52 U/L (ref 38–126)
Anion gap: 9 (ref 5–15)
BUN: 11 mg/dL (ref 6–20)
CO2: 26 mmol/L (ref 22–32)
Calcium: 9.3 mg/dL (ref 8.9–10.3)
Chloride: 105 mmol/L (ref 98–111)
Creatinine, Ser: 0.87 mg/dL (ref 0.61–1.24)
GFR calc Af Amer: >60 mL/min (ref >60)
GFR calc non Af Amer: >60 mL/min (ref >60)
Glucose, Bld: 86 mg/dL (ref 70–99)
Potassium: 3.8 mmol/L (ref 3.5–5.1)
Sodium: 140 mmol/L (ref 135–145)
Total Bilirubin: 0.4 mg/dL (ref 0.3–1.2)
Total Protein: 7.1 g/dL (ref 6.5–8.1)

## 2020-03-12 LAB — ETHANOL: Alcohol, Ethyl (B): <10 mg/dL (ref <10)

## 2020-03-12 LAB — SALICYLATE LEVEL: Salicylate Lvl: <7 mg/dL — ABNORMAL LOW (ref 7.0–30.0)

## 2020-03-12 LAB — ACETAMINOPHEN LEVEL: Acetaminophen (Tylenol), Serum: <10 ug/mL — ABNORMAL LOW (ref 10–30)
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
## 2020-03-12 NOTE — ED Notes (Signed)
Called for pt, no response
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
